# Patient Record
Sex: Female | Born: 1935 | Race: Black or African American | Hispanic: No | Smoking: Never smoker
Health system: Southern US, Community
[De-identification: ages and names within clinical notes are randomized; demographics above are authoritative.]

## PROBLEM LIST (undated history)

## (undated) DIAGNOSIS — E7800 Pure hypercholesterolemia, unspecified: Secondary | ICD-10-CM

## (undated) DIAGNOSIS — C801 Malignant (primary) neoplasm, unspecified: Secondary | ICD-10-CM

## (undated) DIAGNOSIS — K259 Gastric ulcer, unspecified as acute or chronic, without hemorrhage or perforation: Secondary | ICD-10-CM

## (undated) DIAGNOSIS — I10 Essential (primary) hypertension: Secondary | ICD-10-CM

## (undated) HISTORY — PX: TONSILLECTOMY: SUR1361

## (undated) HISTORY — PX: BREAST SURGERY: SHX581

---

## 2015-11-17 ENCOUNTER — Encounter (HOSPITAL_COMMUNITY): Payer: Self-pay | Admitting: Emergency Medicine

## 2015-11-17 ENCOUNTER — Emergency Department (HOSPITAL_COMMUNITY)
Admission: EM | Admit: 2015-11-17 | Discharge: 2015-11-17 | Disposition: A | Discharge status: Home / Self Care | Payer: Medicare Other | Attending: Emergency Medicine | Admitting: Emergency Medicine

## 2015-11-17 ENCOUNTER — Emergency Department (HOSPITAL_COMMUNITY): Payer: Medicare Other

## 2015-11-17 DIAGNOSIS — R062 Wheezing: Secondary | ICD-10-CM

## 2015-11-17 DIAGNOSIS — R531 Weakness: Secondary | ICD-10-CM | POA: Diagnosis not present

## 2015-11-17 DIAGNOSIS — R112 Nausea with vomiting, unspecified: Secondary | ICD-10-CM | POA: Diagnosis present

## 2015-11-17 DIAGNOSIS — I10 Essential (primary) hypertension: Secondary | ICD-10-CM | POA: Insufficient documentation

## 2015-11-17 HISTORY — DX: Malignant (primary) neoplasm, unspecified: C80.1

## 2015-11-17 HISTORY — DX: Pure hypercholesterolemia, unspecified: E78.00

## 2015-11-17 HISTORY — DX: Essential (primary) hypertension: I10

## 2015-11-17 HISTORY — DX: Gastric ulcer, unspecified as acute or chronic, without hemorrhage or perforation: K25.9

## 2015-11-17 LAB — CBC
HEMATOCRIT: 42.3 % (ref 36.0–46.0)
Hemoglobin: 13.6 g/dL (ref 12.0–15.0)
MCH: 30.4 pg (ref 26.0–34.0)
MCHC: 32.2 g/dL (ref 30.0–36.0)
MCV: 94.6 fL (ref 78.0–100.0)
PLATELETS: 236 10*3/uL (ref 150–400)
RBC: 4.47 MIL/uL (ref 3.87–5.11)
RDW: 12.3 % (ref 11.5–15.5)
WBC: 11 10*3/uL — AB (ref 4.0–10.5)

## 2015-11-17 LAB — COMPREHENSIVE METABOLIC PANEL
ALBUMIN: 3.8 g/dL (ref 3.5–5.0)
ALT: 12 U/L — AB (ref 14–54)
AST: 18 U/L (ref 15–41)
Alkaline Phosphatase: 56 U/L (ref 38–126)
Anion gap: 10 (ref 5–15)
BUN: 19 mg/dL (ref 6–20)
CHLORIDE: 98 mmol/L — AB (ref 101–111)
CO2: 32 mmol/L (ref 22–32)
CREATININE: 1.11 mg/dL — AB (ref 0.44–1.00)
Calcium: 9.2 mg/dL (ref 8.9–10.3)
GFR calc Af Amer: 54 mL/min — ABNORMAL LOW (ref >60)
GFR, EST NON AFRICAN AMERICAN: 46 mL/min — AB (ref >60)
Glucose, Bld: 156 mg/dL — ABNORMAL HIGH (ref 65–99)
POTASSIUM: 3.9 mmol/L (ref 3.5–5.1)
SODIUM: 140 mmol/L (ref 135–145)
Total Bilirubin: 0.8 mg/dL (ref 0.3–1.2)
Total Protein: 7.2 g/dL (ref 6.5–8.1)

## 2015-11-17 LAB — URINALYSIS, ROUTINE W REFLEX MICROSCOPIC
GLUCOSE, UA: NEGATIVE mg/dL
HGB URINE DIPSTICK: NEGATIVE
KETONES UR: 15 mg/dL — AB
LEUKOCYTES UA: NEGATIVE
Nitrite: NEGATIVE
PH: 5 (ref 5.0–8.0)
Protein, ur: NEGATIVE mg/dL
Specific Gravity, Urine: 1.03 (ref 1.005–1.030)

## 2015-11-17 LAB — LIPASE, BLOOD: LIPASE: 19 U/L (ref 11–51)

## 2015-11-17 MED ORDER — ONDANSETRON 4 MG PO TBDP: 4.0000 mg | ORAL_TABLET | Freq: Three times a day (TID) | ORAL | Status: DC | PRN

## 2015-11-17 MED ORDER — ALBUTEROL SULFATE HFA 108 (90 BASE) MCG/ACT IN AERS
2.0000 | INHALATION_SPRAY | Freq: Once | RESPIRATORY_TRACT | Status: AC
  Administered 2015-11-17: 2 via RESPIRATORY_TRACT
  Filled 2015-11-17: qty 6.7

## 2015-11-17 MED ORDER — METHYLPREDNISOLONE SODIUM SUCC 125 MG IJ SOLR
125.0000 mg | Freq: Once | INTRAMUSCULAR | Status: AC
  Administered 2015-11-17: 125 mg via INTRAVENOUS
  Filled 2015-11-17: qty 2

## 2015-11-17 MED ORDER — ONDANSETRON HCL 4 MG/2ML IJ SOLN
4.0000 mg | Freq: Once | INTRAMUSCULAR | Status: AC
  Administered 2015-11-17: 4 mg via INTRAVENOUS
  Filled 2015-11-17: qty 2

## 2015-11-17 MED ORDER — ALBUTEROL (5 MG/ML) CONTINUOUS INHALATION SOLN
10.0000 mg/h | INHALATION_SOLUTION | Freq: Once | RESPIRATORY_TRACT | Status: AC
  Administered 2015-11-17: 10 mg/h via RESPIRATORY_TRACT
  Filled 2015-11-17: qty 20

## 2015-11-17 MED ORDER — IPRATROPIUM-ALBUTEROL 0.5-2.5 (3) MG/3ML IN SOLN: 3.0000 mL | Freq: Once | RESPIRATORY_TRACT | Status: DC

## 2015-11-17 MED ORDER — SODIUM CHLORIDE 0.9 % IV SOLN
INTRAVENOUS | Status: DC
  Administered 2015-11-17: 05:00:00 via INTRAVENOUS

## 2015-11-17 MED ORDER — ALBUTEROL SULFATE HFA 108 (90 BASE) MCG/ACT IN AERS: 2.0000 | INHALATION_SPRAY | RESPIRATORY_TRACT | Status: AC | PRN

## 2015-11-17 MED ORDER — PREDNISONE 20 MG PO TABS: 60.0000 mg | ORAL_TABLET | Freq: Every day | ORAL | Status: DC

## 2015-11-17 NOTE — ED Notes (Signed)
Urine specimen obtained and sent to lab

## 2015-11-17 NOTE — ED Notes (Signed)
Patient complaining of cough x 2 weeks, vomiting and weakness starting tonight.

## 2015-11-17 NOTE — Discharge Instructions (Signed)
How to Use an Inhaler Proper inhaler technique is very important. Good technique ensures that the medicine reaches the lungs. Poor technique results in depositing the medicine on the tongue and back of the throat rather than in the airways. If you do not use the inhaler with good technique, the medicine will not help you. STEPS TO FOLLOW IF USING AN INHALER WITHOUT AN EXTENSION TUBE  Remove the cap from the inhaler.  If you are using the inhaler for the first time, you will need to prime it. Shake the inhaler for 5 seconds and release four puffs into the air, away from your face. Ask your health care provider or pharmacist if you have questions about priming your inhaler.  Shake the inhaler for 5 seconds before each breath in (inhalation).  Position the inhaler so that the top of the canister faces up.  Put your index finger on the top of the medicine canister. Your thumb supports the bottom of the inhaler.  Open your mouth.  Either place the inhaler between your teeth and place your lips tightly around the mouthpiece, or hold the inhaler 1-2 inches away from your open mouth. If you are unsure of which technique to use, ask your health care provider.  Breathe out (exhale) normally and as completely as possible.  Press the canister down with your index finger to release the medicine.  At the same time as the canister is pressed, inhale deeply and slowly until your lungs are completely filled. This should take 4-6 seconds. Keep your tongue down.  Hold the medicine in your lungs for 5-10 seconds (10 seconds is best). This helps the medicine get into the small airways of your lungs.  Breathe out slowly, through pursed lips. Whistling is an example of pursed lips.  Wait at least 15-30 seconds between puffs. Continue with the above steps until you have taken the number of puffs your health care provider has ordered. Do not use the inhaler more than your health care provider tells  you.  Replace the cap on the inhaler.  Follow the directions from your health care provider or the inhaler insert for cleaning the inhaler. STEPS TO FOLLOW IF USING AN INHALER WITH AN EXTENSION (SPACER)  Remove the cap from the inhaler.  If you are using the inhaler for the first time, you will need to prime it. Shake the inhaler for 5 seconds and release four puffs into the air, away from your face. Ask your health care provider or pharmacist if you have questions about priming your inhaler.  Shake the inhaler for 5 seconds before each breath in (inhalation).  Place the open end of the spacer onto the mouthpiece of the inhaler.  Position the inhaler so that the top of the canister faces up and the spacer mouthpiece faces you.  Put your index finger on the top of the medicine canister. Your thumb supports the bottom of the inhaler and the spacer.  Breathe out (exhale) normally and as completely as possible.  Immediately after exhaling, place the spacer between your teeth and into your mouth. Close your lips tightly around the spacer.  Press the canister down with your index finger to release the medicine.  At the same time as the canister is pressed, inhale deeply and slowly until your lungs are completely filled. This should take 4-6 seconds. Keep your tongue down and out of the way.  Hold the medicine in your lungs for 5-10 seconds (10 seconds is best). This helps the  medicine get into the small airways of your lungs. Exhale.  Repeat inhaling deeply through the spacer mouthpiece. Again hold that breath for up to 10 seconds (10 seconds is best). Exhale slowly. If it is difficult to take this second deep breath through the spacer, breathe normally several times through the spacer. Remove the spacer from your mouth.  Wait at least 15-30 seconds between puffs. Continue with the above steps until you have taken the number of puffs your health care provider has ordered. Do not use the  inhaler more than your health care provider tells you.  Remove the spacer from the inhaler, and place the cap on the inhaler.  Follow the directions from your health care provider or the inhaler insert for cleaning the inhaler and spacer. If you are using different kinds of inhalers, use your quick relief medicine to open the airways 10-15 minutes before using a steroid if instructed to do so by your health care provider. If you are unsure which inhalers to use and the order of using them, ask your health care provider, nurse, or respiratory therapist. If you are using a steroid inhaler, always rinse your mouth with water after your last puff, then gargle and spit out the water. Do not swallow the water. AVOID:  Inhaling before or after starting the spray of medicine. It takes practice to coordinate your breathing with triggering the spray.  Inhaling through the nose (rather than the mouth) when triggering the spray. HOW TO DETERMINE IF YOUR INHALER IS FULL OR NEARLY EMPTY You cannot know when an inhaler is empty by shaking it. A few inhalers are now being made with dose counters. Ask your health care provider for a prescription that has a dose counter if you feel you need that extra help. If your inhaler does not have a counter, ask your health care provider to help you determine the date you need to refill your inhaler. Write the refill date on a calendar or your inhaler canister. Refill your inhaler 7-10 days before it runs out. Be sure to keep an adequate supply of medicine. This includes making sure it is not expired, and that you have a spare inhaler.  SEEK MEDICAL CARE IF:   Your symptoms are only partially relieved with your inhaler.  You are having trouble using your inhaler.  You have some increase in phlegm. SEEK IMMEDIATE MEDICAL CARE IF:   You feel little or no relief with your inhalers. You are still wheezing and are feeling shortness of breath or tightness in your chest or  both.  You have dizziness, headaches, or a fast heart rate.  You have chills, fever, or night sweats.  You have a noticeable increase in phlegm production, or there is blood in the phlegm. MAKE SURE YOU:   Understand these instructions.  Will watch your condition.  Will get help right away if you are not doing well or get worse.   This information is not intended to replace advice given to you by your health care provider. Make sure you discuss any questions you have with your health care provider.   Document Released: 08/04/2000 Document Revised: 05/28/2013 Document Reviewed: 03/06/2013 Elsevier Interactive Patient Education 2016 Else[employer] Inc[employer]  Cough, Adult Coughing is a reflex that clears your throat and your airways. Coughing helps to heal and protect your lungs. It is normal to cough occasionally, but a cough that happens with other symptoms or lasts a long time may be a sign of a condition that  needs treatment. A cough may last only 2-3 weeks (acute), or it may last longer than 8 weeks (chronic). CAUSES Coughing is commonly caused by:  Breathing in substances that irritate your lungs.  A viral or bacterial respiratory infection.  Allergies.  Asthma.  Postnasal drip.  Smoking.  Acid backing up from the stomach into the esophagus (gastroesophageal reflux).  Certain medicines.  Chronic lung problems, including COPD (or rarely, lung cancer).  Other medical conditions such as heart failure. HOME CARE INSTRUCTIONS  Pay attention to any changes in your symptoms. Take these actions to help with your discomfort:  Take medicines only as told by your health care provider.  If you were prescribed an antibiotic medicine, take it as told by your health care provider. Do not stop taking the antibiotic even if you start to feel better.  Talk with your health care provider before you take a cough suppressant medicine.  Drink enough fluid to keep your urine clear or pale  yellow.  If the air is dry, use a cold steam vaporizer or humidifier in your bedroom or your home to help loosen secretions.  Avoid anything that causes you to cough at work or at home.  If your cough is worse at night, try sleeping in a semi-upright position.  Avoid cigarette smoke. If you smoke, quit smoking. If you need help quitting, ask your health care provider.  Avoid caffeine.  Avoid alcohol.  Rest as needed. SEEK MEDICAL CARE IF:   You have new symptoms.  You cough up pus.  Your cough does not get better after 2-3 weeks, or your cough gets worse.  You cannot control your cough with suppressant medicines and you are losing sleep.  You develop pain that is getting worse or pain that is not controlled with pain medicines.  You have a fever.  You have unexplained weight loss.  You have night sweats. SEEK IMMEDIATE MEDICAL CARE IF:  You cough up blood.  You have difficulty breathing.  Your heartbeat is very fast.   This information is not intended to replace advice given to you by your health care provider. Make sure you discuss any questions you have with your health care provider.   Document Released: 02/03/2011 Document Revised: 04/28/2015 Document Reviewed: 10/14/2014 Elsevier Interactive Patient Education 2016 Else[employer] Inc.  Bronchospasm, Adult A bronchospasm is when the tubes that carry air in and out of your lungs (airways) spasm or tighten. During a bronchospasm it is hard to breathe. This is because the airways get smaller. A bronchospasm can be triggered by:  Allergies. These may be to animals, pollen, food, or mold.  Infection. This is a common cause of bronchospasm.  Exercise.  Irritants. These include pollution, cigarette smoke, strong odors, aerosol sprays, and paint fumes.  Weather changes.  Stress.  Being emotional. HOME CARE   Always have a plan for getting help. Know when to call your doctor and local emergency services (911 in the  [place] Know where you can get emergency care.  Only take medicines as told by your doctor.  If you were prescribed an inhaler or nebulizer machine, ask your doctor how to use it correctly. Always use a spacer with your inhaler if you were given one.  Stay calm during an attack. Try to relax and breathe more slowly.  Control your home environment:  Change your heating and air conditioning filter at least once a month.  Limit your use of fireplaces and wood stoves.  Do not  smoke.  Do not  allow smoking in your home.  Avoid perfumes and fragrances.  Get rid of pests (such as roaches and mice) and their droppings.  Throw away plants if you see mold on them.  Keep your house clean and dust free.  Replace carpet with wood, tile, or vinyl flooring. Carpet can trap dander and dust.  Use allergy-proof pillows, mattress covers, and box spring covers.  Wash bed sheets and blankets every week in hot water. Dry them in a dryer.  Use blankets that are made of polyester or cotton.  Wash hands frequently. GET HELP IF:  You have muscle aches.  You have chest pain.  The thick spit you spit or cough up (sputum) changes from clear or white to yellow, green, gray, or bloody.  The thick spit you spit or cough up gets thicker.  There are problems that may be related to the medicine you are given such as:  A rash.  Itching.  Swelling.  Trouble breathing. GET HELP RIGHT AWAY IF:  You feel you cannot breathe or catch your breath.  You cannot stop coughing.  Your treatment is not helping you breathe better.  You have very bad chest pain. MAKE SURE YOU:   Understand these instructions.  Will watch your condition.  Will get help right away if you are not doing well or get worse.   This information is not intended to replace advice given to you by your health care provider. Make sure you discuss any questions you have with your health care provider.   Document Released:  06/04/2009 Document Revised: 08/28/2014 Document Reviewed: 01/28/2013 Elsevier Interactive Patient Education 2016 Elsevier Inc.    Nausea and Vomiting Nausea is a sick feeling that often comes before throwing up (vomiting). Vomiting is a reflex where stomach contents come out of your mouth. Vomiting can cause severe loss of body fluids (dehydration). Children and elderly adults can become dehydrated quickly, especially if they also have diarrhea. Nausea and vomiting are symptoms of a condition or disease. It is important to find the cause of your symptoms. CAUSES   Direct irritation of the stomach lining. This irritation can result from increased acid production (gastroesophageal reflux disease), infection, food poisoning, taking certain medicines (such as nonsteroidal anti-inflammatory drugs), alcohol use, or tobacco use.  Signals from the brain.These signals could be caused by a headache, heat exposure, an inner ear disturbance, increased pressure in the brain from injury, infection, a tumor, or a concussion, pain, emotional stimulus, or metabolic problems.  An obstruction in the gastrointestinal tract (bowel obstruction).  Illnesses such as diabetes, hepatitis, gallbladder problems, appendicitis, kidney problems, cancer, sepsis, atypical symptoms of a heart attack, or eating disorders.  Medical treatments such as chemotherapy and radiation.  Receiving medicine that makes you sleep (general anesthetic) during surgery. DIAGNOSIS Your caregiver may ask for tests to be done if the problems do not improve after a few days. Tests may also be done if symptoms are severe or if the reason for the nausea and vomiting is not clear. Tests may include:  Urine tests.  Blood tests.  Stool tests.  Cultures (to look for evidence of infection).  X-rays or other imaging studies. Test results can help your caregiver make decisions about treatment or the need for additional tests. TREATMENT You  need to stay well hydrated. Drink frequently but in small amounts.You may wish to drink water, sports drinks, clear broth, or eat frozen ice pops or gelatin dessert to help stay hydrated.When you eat,  eating slowly may help prevent nausea.There are also some antinausea medicines that may help prevent nausea. HOME CARE INSTRUCTIONS   Take all medicine as directed by your caregiver.  If you do not have an appetite, do not force yourself to eat. However, you must continue to drink fluids.  If you have an appetite, eat a normal diet unless your caregiver tells you differently.  Eat a variety of complex carbohydrates (rice, wheat, potatoes, bread), lean meats, yogurt, fruits, and vegetables.  Avoid high-fat foods because they are more difficult to digest.  Drink enough water and fluids to keep your urine clear or pale yellow.  If you are dehydrated, ask your caregiver for specific rehydration instructions. Signs of dehydration may include:  Severe thirst.  Dry lips and mouth.  Dizziness.  Dark urine.  Decreasing urine frequency and amount.  Confusion.  Rapid breathing or pulse. SEEK IMMEDIATE MEDICAL CARE IF:   You have blood or brown flecks (like coffee grounds) in your vomit.  You have black or bloody stools.  You have a severe headache or stiff neck.  You are confused.  You have severe abdominal pain.  You have chest pain or trouble breathing.  You do not urinate at least once every 8 hours.  You develop cold or clammy skin.  You continue to vomit for longer than 24 to 48 hours.  You have a fever. MAKE SURE YOU:   Understand these instructions.  Will watch your condition.  Will get help right away if you are not doing well or get worse.   This information is not intended to replace advice given to you by your health care provider. Make sure you discuss any questions you have with your health care provider.   Document Released: 08/07/2005 Document Revised:  10/30/2011 Document Reviewed: 01/04/2011 Elsevier Interactive Patient Education [employer].

## 2015-11-17 NOTE — ED Notes (Signed)
Pt reports N/V and weakness- she denies any pain of chest, abd, or urinary symptoms. She is tremulous upon arrival to rm 9, but is still and conversant currently

## 2015-11-17 NOTE — ED Provider Notes (Signed)
Patient felt better after hour-long neb. Both she and family agreed to discharge. Discharge prescriptions per Dr. Leonides Schanz.  Nat Christen, MD 11/17/15 [phone]

## 2015-11-17 NOTE — ED Provider Notes (Signed)
TIME SEEN: 4:10 AM  CHIEF COMPLAINT: Cough, vomiting, generalized weakness  HPI: Pt is a 81 y.o. female with history of hypertension, hyperlipidemia, gastric ulcer who presents to the emergency department with complaints of nausea and vomiting that started last night and generalized weakness. Has also had a cough for the past 2 weeks with white sputum production. Denies any chest pain or chest discomfort. No abdominal pain. No fevers. Has been hot and cold. No shortness of breath currently. No history of asthma or COPD. No sick contacts or recent travel. No antibody cues or hospitalization. No prior abdominal surgery. No diarrhea. No dysuria, hematuria, vaginal bleeding or discharge.  ROS: See HPI Constitutional: no fever  Eyes: no drainage  ENT: no runny nose   Cardiovascular:  no chest pain  Resp: no SOB  GI: no vomiting GU: no dysuria Integumentary: no rash  Allergy: no hives  Musculoskeletal: no leg swelling  Neurological: no slurred speech ROS otherwise negative  PAST MEDICAL HISTORY/PAST SURGICAL HISTORY:  Past Medical History  Diagnosis Date  . Hypertension   . Stomach ulcer   . High cholesterol   . [facility])     MEDICATIONS:  Prior to Admission medications   Not on File    ALLERGIES:  Allergies  Allergen Reactions  . Aspirin     SOCIAL HISTORY:  Social History  Substance Use Topics  . Smoking status: Never Smoker   . Smokeless tobacco: Not on file  . Alcohol Use: No    FAMILY HISTORY: History reviewed. No pertinent family history.  EXAM: BP 134/66 mmHg  Pulse 69  Temp(Src) 98.9 F (37.2 C) (Oral)  Resp 28  Ht 5\' 4"  (1.626 m)  Wt 196 lb (88.905 kg)  BMI 33.63 kg/m2  SpO2 91% CONSTITUTIONAL: Alert and oriented and responds appropriately to questions. Chronically ill-appearing, appears well-hydrated, afebrile and nontoxic, in no distress HEAD: Normocephalic EYES: Conjunctivae clear, PERRL ENT: normal nose; no rhinorrhea; moist mucous  membranes NECK: Supple, no meningismus, no LAD  CARD: RRR; S1 and S2 appreciated; no murmurs, no clicks, no rubs, no gallops RESP: Normal chest excursion without splinting or tachypnea; breath sounds are equal bilaterally but she does have mild expiratory wheezing at her bases bilaterally, no rhonchi, no rales, no hypoxia or respiratory distress, speaking full sentences ABD/GI: Normal bowel sounds; non-distended; soft, non-tender, no rebound, no guarding, no peritoneal signs, no tenderness on a brace point, negative Murphy sign BACK:  The back appears normal and is non-tender to palpation, there is no CVA tenderness EXT: Normal ROM in all joints; non-tender to palpation; no edema; normal capillary refill; no cyanosis, no calf tenderness or swelling    SKIN: Normal color for age and race; warm; no rash NEURO: Moves all extremities equally, sensation to light touch intact diffusely, cranial nerves II through XII intact PSYCH: The patient's mood and manner are appropriate. Grooming and personal hygiene are appropriate.  MEDICAL DECISION MAKING: Patient here with complaints of nausea, vomiting that started last night with generalized weakness. No diarrhea. She's afebrile and nontoxic appearing. Her abdominal exam is benign. Also complaining of cough for 2 weeks but no chest pain or shortness of breath. Does have some mild wheezing on exam. Will give duo neb. Chest x-ray ordered triage is unremarkable.  We'll obtain labs, urine. Differential diagnosis includes gastroenteritis, gastritis. Doubt ACS, bowel obstruction, colitis, diverticulitis, appendicitis, cholecystitis based on her benign exam and not having a complaints of pain. We'll give IV fluids, IV Zofran and fluid challenge patient.  ED PROGRESS: Patient's labs are unremarkable other than mild leukocytosis and mildly elevated creatinine. Urine shows small ketones but no sign of infection. She is able to drink without difficulty. Abdominal exam is  still benign. Patient has documented oxygen saturation in the upper 80s on room air but this is when she is sleeping. Suspect that this is a component of sleep apnea. When she wakes up her sats immediately jump into the upper 90s. She is able to ambulate and her oxygen saturation does not drop below 94%. She does not feel short of breath. She has not yet received her breathing treatment. On reevaluation she seems to have slightly worsened wheezing. We'll give continuous albuterol and Solu-Medrol. She was a previous smoker and smoked for 20 years but quit 30 years ago. States she's never been diagnosed with asthma or COPD. Her chest x-ray shows no infiltrate, edema. Doubt pulmonary embolus given she is not having tachycardia, tachypnea, chest pain or shortness of breath currently.  We'll reassess after continuous albuterol but I suspect that she will be able to be discharged home with steroid burst, Zofran to take as needed. Discussed return cautions with patient and her family at bedside. They're comfortable with this plan.    At this time, I do not feel there is any life-threatening condition present. I have reviewed and discussed all results (EKG, imaging, lab, urine as appropriate), exam findings with patient. I have reviewed nursing notes and appropriate previous records.  I feel the patient is safe to be discharged home without further emergent workup. Discussed usual and customary return precautions. Patient and family (if present) verbalize understanding and are comfortable with this plan.  Patient will follow-up with their primary care provider. If they do not have a primary care provider, information for follow-up has been provided to them. All questions have been answered.          [place], DO 11/17/15 [phone]

## 2015-11-17 NOTE — ED Notes (Signed)
Call to lab re: unable o draw labs

## 2016-11-05 ENCOUNTER — Emergency Department (HOSPITAL_COMMUNITY)
Admission: EM | Admit: 2016-11-05 | Discharge: 2016-11-05 | Disposition: A | Discharge status: Home / Self Care | Payer: Medicare Other | Attending: Emergency Medicine | Admitting: Emergency Medicine

## 2016-11-05 ENCOUNTER — Encounter (HOSPITAL_COMMUNITY): Payer: Self-pay | Admitting: *Deleted

## 2016-11-05 ENCOUNTER — Emergency Department (HOSPITAL_COMMUNITY): Payer: Medicare Other

## 2016-11-05 DIAGNOSIS — R112 Nausea with vomiting, unspecified: Secondary | ICD-10-CM | POA: Diagnosis present

## 2016-11-05 DIAGNOSIS — K529 Noninfective gastroenteritis and colitis, unspecified: Secondary | ICD-10-CM | POA: Diagnosis not present

## 2016-11-05 DIAGNOSIS — I10 Essential (primary) hypertension: Secondary | ICD-10-CM | POA: Insufficient documentation

## 2016-11-05 DIAGNOSIS — Z79899 Other long term (current) drug therapy: Secondary | ICD-10-CM | POA: Insufficient documentation

## 2016-11-05 DIAGNOSIS — R197 Diarrhea, unspecified: Secondary | ICD-10-CM

## 2016-11-05 LAB — COMPREHENSIVE METABOLIC PANEL
ALBUMIN: 4 g/dL (ref 3.5–5.0)
ALK PHOS: 40 U/L (ref 38–126)
ALT: 11 U/L — AB (ref 14–54)
ANION GAP: 8 (ref 5–15)
AST: 24 U/L (ref 15–41)
BUN: 15 mg/dL (ref 6–20)
CALCIUM: 9 mg/dL (ref 8.9–10.3)
CHLORIDE: 101 mmol/L (ref 101–111)
CO2: 30 mmol/L (ref 22–32)
CREATININE: 0.89 mg/dL (ref 0.44–1.00)
GFR calc Af Amer: >60 mL/min (ref >60)
GFR calc non Af Amer: >60 mL/min (ref >60)
GLUCOSE: 170 mg/dL — AB (ref 65–99)
Potassium: 3.7 mmol/L (ref 3.5–5.1)
Sodium: 139 mmol/L (ref 135–145)
Total Bilirubin: 1 mg/dL (ref 0.3–1.2)
Total Protein: 7.3 g/dL (ref 6.5–8.1)

## 2016-11-05 LAB — CBC WITH DIFFERENTIAL/PLATELET
BASOS ABS: 0 10*3/uL (ref 0.0–0.1)
BASOS PCT: 0 %
Eosinophils Absolute: 0 10*3/uL (ref 0.0–0.7)
Eosinophils Relative: 0 %
HCT: 42 % (ref 36.0–46.0)
Hemoglobin: 13.7 g/dL (ref 12.0–15.0)
LYMPHS PCT: 8 %
Lymphs Abs: 1 10*3/uL (ref 0.7–4.0)
MCH: 30.8 pg (ref 26.0–34.0)
MCHC: 32.6 g/dL (ref 30.0–36.0)
MCV: 94.4 fL (ref 78.0–100.0)
MONO ABS: 0.6 10*3/uL (ref 0.1–1.0)
Monocytes Relative: 5 %
NEUTROS ABS: 10 10*3/uL — AB (ref 1.7–7.7)
NEUTROS PCT: 87 %
Platelets: 208 10*3/uL (ref 150–400)
RBC: 4.45 MIL/uL (ref 3.87–5.11)
RDW: 12.6 % (ref 11.5–15.5)
WBC: 11.7 10*3/uL — AB (ref 4.0–10.5)

## 2016-11-05 LAB — URINALYSIS, ROUTINE W REFLEX MICROSCOPIC
BILIRUBIN URINE: NEGATIVE
Glucose, UA: NEGATIVE mg/dL
Hgb urine dipstick: NEGATIVE
Ketones, ur: NEGATIVE mg/dL
Leukocytes, UA: NEGATIVE
NITRITE: NEGATIVE
PH: 7 (ref 5.0–8.0)
Protein, ur: NEGATIVE mg/dL
Specific Gravity, Urine: 1.017 (ref 1.005–1.030)

## 2016-11-05 LAB — LIPASE, BLOOD: Lipase: 16 U/L (ref 11–51)

## 2016-11-05 MED ORDER — ONDANSETRON HCL 4 MG/2ML IJ SOLN
4.0000 mg | Freq: Once | INTRAMUSCULAR | Status: AC
  Administered 2016-11-05: 4 mg via INTRAVENOUS

## 2016-11-05 MED ORDER — SODIUM CHLORIDE 0.9 % IV BOLUS (SEPSIS)
1000.0000 mL | Freq: Once | INTRAVENOUS | Status: AC
  Administered 2016-11-05: 1000 mL via INTRAVENOUS

## 2016-11-05 MED ORDER — IOPAMIDOL (ISOVUE-300) INJECTION 61%
INTRAVENOUS | Status: AC
  Filled 2016-11-05: qty 30

## 2016-11-05 MED ORDER — IOPAMIDOL (ISOVUE-300) INJECTION 61%
INTRAVENOUS | Status: AC
  Filled 2016-11-05: qty 100

## 2016-11-05 MED ORDER — ONDANSETRON 4 MG PO TBDP: ORAL_TABLET | ORAL | 0 refills | Status: AC

## 2016-11-05 MED ORDER — ONDANSETRON HCL 4 MG/2ML IJ SOLN
INTRAMUSCULAR | Status: AC
  Filled 2016-11-05: qty 2

## 2016-11-05 MED ORDER — IOPAMIDOL (ISOVUE-300) INJECTION 61%
100.0000 mL | Freq: Once | INTRAVENOUS | Status: AC | PRN
  Administered 2016-11-05: 100 mL via INTRAVENOUS

## 2016-11-05 NOTE — Discharge Instructions (Signed)
Take Tylenol or Motrin for pain. Follow-up with her doctor this week if not improving

## 2016-11-05 NOTE — ED Notes (Signed)
Pt O2 saturation dropped to 84 % with good wave form. This nurse to room, pt was sleeping, awoke pt and encouraged taking deep breathes. O2 sats came up to 99% with this intervention.

## 2016-11-05 NOTE — ED Notes (Signed)
IV access attempted x 2 without success- Rip Harbour, RN, charge nurse notified.

## 2016-11-05 NOTE — ED Provider Notes (Signed)
CT scan is consistent with gastroenteritis. Patient symptoms have improved with Zofran. She no longer has nausea pain or diarrhea. She will be given Zofran. Told to take Tylenol or Motrin. Told to drink plenty of fluids and follow-up with her PCP this week   Milton Ferguson, MD 11/05/16 [id number]

## 2016-11-05 NOTE — ED Triage Notes (Signed)
Pt c/o abd pain with diarrhea, n/v that started during the night,

## 2016-11-05 NOTE — ED Notes (Signed)
Pt made aware to return if symptoms worsen or if any life threatening symptoms occur.   

## 2016-11-05 NOTE — ED Notes (Signed)
Pt taken to CT.

## 2016-11-05 NOTE — ED Notes (Signed)
Pt currently sleeping. Equal rise and fall noted to chest. Family members sleeping at well.

## 2016-11-05 NOTE — ED Provider Notes (Signed)
[facility] DEPT Provider Note   CSN: [id number] Arrival date & time: 11/05/16  0341     History   Chief Complaint Chief Complaint  Patient presents with  . Abdominal Pain    HPI Elizabeth Weaver is a 82 y.o. female.  Patient woke from sleep around 2 AM with epigastric pain, vomiting and diarrhea. Has had 3 episodes of clear emesis and one episode of loose stool. Denies any blood in the emesis or stool. No sick contacts. No recent travel. No recent antibiotic use. Felt well when she went to bed around 10 PM. She denies any chest pain or shortness of breath. Pain is constant and worse with palpation. She's never had this before. History list ulcers though patient denies this. Does not take any medications. Denies any fever. Denies any urinary or vaginal symptoms.   The history is provided by the patient.  Abdominal Pain   Associated symptoms include diarrhea, nausea and vomiting. Pertinent negatives include fever, dysuria, hematuria, headaches, arthralgias and myalgias.    Past Medical History:  Diagnosis Date  . Cancer ([place])   . High cholesterol   . Hypertension   . Stomach ulcer     There are no active problems to display for this patient.   Past Surgical History:  Procedure Laterality Date  . BREAST SURGERY    . TONSILLECTOMY      OB History    No data available       Home Medications    Prior to Admission medications   Medication Sig Start Date End Date Taking? Authorizing Provider  albuterol (PROVENTIL HFA;VENTOLIN HFA) 108 (90 Base) MCG/ACT inhaler Inhale 2 puffs into the lungs every 4 (four) hours as needed for wheezing or shortness of breath. 11/17/15  Yes Kristen N Ward, DO  atenolol (TENORMIN) 25 MG tablet Take 50 mg by mouth daily. Take two tablets daily   Yes Historical Provider, MD  atorvastatin (LIPITOR) 20 MG tablet Take 20 mg by mouth daily.   Yes Historical Provider, MD  hydrochlorothiazide (HYDRODIURIL) 25 MG tablet Take 25 mg by mouth daily.    Yes Historical Provider, MD  NIFEdipine (ADALAT CC) 90 MG 24 hr tablet Take 90 mg by mouth daily.   Yes Historical Provider, MD  Potassium Chloride CR (MICRO-K) 8 MEQ CPCR capsule CR Take 8 mEq by mouth daily. Take 3 tablets daily   Yes Historical Provider, MD  quinapril (ACCUPRIL) 40 MG tablet Take 40 mg by mouth daily.   Yes Historical Provider, MD  tamoxifen (NOLVADEX) 20 MG tablet Take 20 mg by mouth daily.   Yes Historical Provider, MD  ondansetron (ZOFRAN ODT) 4 MG disintegrating tablet Take 1 tablet (4 mg total) by mouth every 8 (eight) hours as needed for nausea or vomiting. 11/17/15   Kristen N Ward, DO  predniSONE (DELTASONE) 20 MG tablet Take 3 tablets (60 mg total) by mouth daily. 11/17/15   Hawkins, DO    Family History No family history on file.  Social History Social History  Substance Use Topics  . Smoking status: Never Smoker  . Smokeless tobacco: Never Used  . Alcohol use No     Allergies   Aspirin   Review of Systems Review of Systems  Constitutional: Positive for activity change and appetite change. Negative for fever.  HENT: Negative for congestion and rhinorrhea.   Respiratory: Negative for cough, chest tightness and shortness of breath.   Cardiovascular: Negative for chest pain.  Gastrointestinal: Positive for abdominal pain, diarrhea,  nausea and vomiting.  Genitourinary: Negative for dysuria, hematuria, vaginal bleeding and vaginal discharge.  Musculoskeletal: Negative for arthralgias and myalgias.  Skin: Negative for rash.  Neurological: Negative for dizziness, weakness and headaches.  A complete 10 system review of systems was obtained and all systems are negative except as noted in the HPI and PMH.     Physical Exam Updated Vital Signs BP 133/64   Pulse 80   Temp 98.1 F (36.7 C) (Oral)   Resp 17   Ht 5\' 4"  (1.626 m)   Wt 190 lb (86.2 kg)   SpO2 98%   BMI 32.61 kg/m   Physical Exam  Constitutional: She is oriented to person, place,  and time. She appears well-developed and well-nourished. No distress.  Spitting up clear emesis  HENT:  Head: Normocephalic and atraumatic.  Mouth/Throat: Oropharynx is clear and moist. No oropharyngeal exudate.  Eyes: Conjunctivae and EOM are normal. Pupils are equal, round, and reactive to light.  Neck: Normal range of motion. Neck supple.  No meningismus.  Cardiovascular: Normal rate, regular rhythm, normal heart sounds and intact distal pulses.   No murmur heard. Pulmonary/Chest: Effort normal and breath sounds normal. No respiratory distress.  Abdominal: Soft. There is tenderness. There is no rebound and no guarding.  Epigastric tenderness, no guarding or rebound  Musculoskeletal: Normal range of motion. She exhibits no edema or tenderness.  Neurological: She is alert and oriented to person, place, and time. No cranial nerve deficit. She exhibits normal muscle tone. Coordination normal.   5/5 strength throughout. CN 2-12 intact.Equal grip strength.   Skin: Skin is warm.  Psychiatric: She has a normal mood and affect. Her behavior is normal.  Nursing note and vitals reviewed.    ED Treatments / Results  Labs (all labs ordered are listed, but only abnormal results are displayed) Labs Reviewed  CBC WITH DIFFERENTIAL/PLATELET - Abnormal; Notable for the following:       Result Value   WBC 11.7 (*)    Neutro Abs 10.0 (*)    All other components within normal limits  COMPREHENSIVE METABOLIC PANEL - Abnormal; Notable for the following:    Glucose, Bld 170 (*)    ALT 11 (*)    All other components within normal limits  LIPASE, BLOOD  URINALYSIS, ROUTINE W REFLEX MICROSCOPIC    EKG  EKG Interpretation  Date/Time:  Sunday November 05 2016 04:34:54 EDT Ventricular Rate:  72 PR Interval:    QRS Duration: 89 QT Interval:  389 QTC Calculation: 426 R Axis:   79 Text Interpretation:  Sinus rhythm Consider left atrial enlargement No previous ECGs available Confirmed by Wyvonnia Dusky  MD,  Nadir Vasques ([id number]) on 11/05/2016 5:00:45 AM       Radiology No results found.  Procedures Procedures (including critical care time)  Medications Ordered in ED Medications  sodium chloride 0.9 % bolus 1,000 mL (1,000 mLs Intravenous New Bag/Given 11/05/16 0516)  ondansetron (ZOFRAN) injection 4 mg (4 mg Intravenous Given 11/05/16 0515)     Initial Impression / Assessment and Plan / ED Course  I have reviewed the triage vital signs and the nursing notes.  Pertinent labs & imaging results that were available during my care of the patient were reviewed by me and considered in my medical decision making (see chart for details).     Epigastric pain with vomiting and one episode of diarrhea.  No blood in emesis or stool.  Felt well when she went to bed.  IVF, antiemetics, labs, UA.  Labs reassuring.  Vomiting has subsided.   CT pending for further evaluation.  UA pending as well.  Dr. Roderic Palau to assume care at shift change.  Final Clinical Impressions(s) / ED Diagnoses   Final diagnoses:  None    New Prescriptions New Prescriptions   No medications on file     Ezequiel Essex, MD 11/05/16 [phone]

## 2017-10-20 ENCOUNTER — Emergency Department (HOSPITAL_COMMUNITY)
Admission: EM | Admit: 2017-10-20 | Discharge: 2017-10-21 | Disposition: A | Discharge status: Home / Self Care | Payer: Medicare Other | Attending: Emergency Medicine | Admitting: Emergency Medicine

## 2017-10-20 ENCOUNTER — Emergency Department (HOSPITAL_COMMUNITY): Payer: Medicare Other

## 2017-10-20 ENCOUNTER — Encounter (HOSPITAL_COMMUNITY): Payer: Self-pay | Admitting: *Deleted

## 2017-10-20 ENCOUNTER — Other Ambulatory Visit: Payer: Self-pay

## 2017-10-20 DIAGNOSIS — J9801 Acute bronchospasm: Secondary | ICD-10-CM

## 2017-10-20 DIAGNOSIS — Z859 Personal history of malignant neoplasm, unspecified: Secondary | ICD-10-CM | POA: Diagnosis not present

## 2017-10-20 DIAGNOSIS — R531 Weakness: Secondary | ICD-10-CM | POA: Diagnosis present

## 2017-10-20 DIAGNOSIS — Z79899 Other long term (current) drug therapy: Secondary | ICD-10-CM | POA: Diagnosis not present

## 2017-10-20 DIAGNOSIS — J101 Influenza due to other identified influenza virus with other respiratory manifestations: Secondary | ICD-10-CM | POA: Diagnosis not present

## 2017-10-20 LAB — COMPREHENSIVE METABOLIC PANEL
ALK PHOS: 39 U/L (ref 38–126)
ALT: 16 U/L (ref 14–54)
ANION GAP: 13 (ref 5–15)
AST: 23 U/L (ref 15–41)
Albumin: 3.8 g/dL (ref 3.5–5.0)
BILIRUBIN TOTAL: 0.8 mg/dL (ref 0.3–1.2)
BUN: 17 mg/dL (ref 6–20)
CALCIUM: 9.2 mg/dL (ref 8.9–10.3)
CO2: 25 mmol/L (ref 22–32)
Chloride: 103 mmol/L (ref 101–111)
Creatinine, Ser: 1.2 mg/dL — ABNORMAL HIGH (ref 0.44–1.00)
GFR, EST AFRICAN AMERICAN: 48 mL/min — AB (ref >60)
GFR, EST NON AFRICAN AMERICAN: 42 mL/min — AB (ref >60)
Glucose, Bld: 109 mg/dL — ABNORMAL HIGH (ref 65–99)
Potassium: 3.3 mmol/L — ABNORMAL LOW (ref 3.5–5.1)
Sodium: 141 mmol/L (ref 135–145)
TOTAL PROTEIN: 7.1 g/dL (ref 6.5–8.1)

## 2017-10-20 LAB — CBC WITH DIFFERENTIAL/PLATELET
Basophils Absolute: 0 10*3/uL (ref 0.0–0.1)
Basophils Relative: 0 %
EOS ABS: 0 10*3/uL (ref 0.0–0.7)
Eosinophils Relative: 0 %
HEMATOCRIT: 39 % (ref 36.0–46.0)
HEMOGLOBIN: 12.4 g/dL (ref 12.0–15.0)
LYMPHS ABS: 0.8 10*3/uL (ref 0.7–4.0)
Lymphocytes Relative: 10 %
MCH: 30.4 pg (ref 26.0–34.0)
MCHC: 31.8 g/dL (ref 30.0–36.0)
MCV: 95.6 fL (ref 78.0–100.0)
MONOS PCT: 5 %
Monocytes Absolute: 0.4 10*3/uL (ref 0.1–1.0)
NEUTROS ABS: 6.9 10*3/uL (ref 1.7–7.7)
NEUTROS PCT: 85 %
Platelets: 166 10*3/uL (ref 150–400)
RBC: 4.08 MIL/uL (ref 3.87–5.11)
RDW: 12.8 % (ref 11.5–15.5)
WBC: 8.1 10*3/uL (ref 4.0–10.5)

## 2017-10-20 LAB — INFLUENZA PANEL BY PCR (TYPE A & B)
Influenza A By PCR: POSITIVE — AB
Influenza B By PCR: NEGATIVE

## 2017-10-20 MED ORDER — ALBUTEROL SULFATE (2.5 MG/3ML) 0.083% IN NEBU
5.0000 mg | INHALATION_SOLUTION | Freq: Once | RESPIRATORY_TRACT | Status: AC
  Administered 2017-10-20: 5 mg via RESPIRATORY_TRACT
  Filled 2017-10-20: qty 6

## 2017-10-20 MED ORDER — ACETAMINOPHEN 500 MG PO TABS
ORAL_TABLET | ORAL | Status: AC
  Filled 2017-10-20: qty 2

## 2017-10-20 MED ORDER — SODIUM CHLORIDE 0.9 % IV BOLUS (SEPSIS)
500.0000 mL | Freq: Once | INTRAVENOUS | Status: AC
  Administered 2017-10-20: 500 mL via INTRAVENOUS

## 2017-10-20 MED ORDER — ACETAMINOPHEN 500 MG PO TABS
1000.0000 mg | ORAL_TABLET | Freq: Once | ORAL | Status: AC
  Administered 2017-10-20: 1000 mg via ORAL

## 2017-10-20 MED ORDER — METHYLPREDNISOLONE SODIUM SUCC 125 MG IJ SOLR
125.0000 mg | Freq: Once | INTRAMUSCULAR | Status: AC
  Administered 2017-10-20: 125 mg via INTRAVENOUS
  Filled 2017-10-20: qty 2

## 2017-10-20 MED ORDER — OSELTAMIVIR PHOSPHATE 30 MG PO CAPS
30.0000 mg | ORAL_CAPSULE | Freq: Once | ORAL | Status: AC
  Administered 2017-10-21: 30 mg via ORAL
  Filled 2017-10-20: qty 1

## 2017-10-20 MED ORDER — ALBUTEROL (5 MG/ML) CONTINUOUS INHALATION SOLN
10.0000 mg/h | INHALATION_SOLUTION | RESPIRATORY_TRACT | Status: DC
  Administered 2017-10-20: 10 mg/h via RESPIRATORY_TRACT
  Filled 2017-10-20: qty 20

## 2017-10-20 NOTE — ED Provider Notes (Signed)
[facility] EMERGENCY DEPARTMENT Provider Note   CSN: [id number] Arrival date & time: 10/20/17  1759     History   Chief Complaint Chief Complaint  Patient presents with  . Weakness    HPI Elizabeth Weaver is a 83 y.o. female.  HPI Presents with acute onset fever, chills, generalized weakness, cough, shortness of breath and wheezing starting this morning.  She had mild nausea.  Denies chest pain or abdominal pain.  No nasal congestion or sore throat.  Denies headache.  Denies neck pain or stiffness.  No urinary symptoms.  Has been using nebulizer at home.  Has been exposed to relative with recent flu-like illness. Past Medical History:  Diagnosis Date  . Cancer ([place])   . High cholesterol   . Hypertension   . Stomach ulcer     There are no active problems to display for this patient.   Past Surgical History:  Procedure Laterality Date  . BREAST SURGERY    . TONSILLECTOMY      OB History    No data available       Home Medications    Prior to Admission medications   Medication Sig Start Date End Date Taking? Authorizing Provider  Cholecalciferol (VITAMIN D3) 1000 units CAPS Take 1 capsule by mouth daily. 06/14/11  Yes [provider]  albuterol (PROVENTIL HFA;VENTOLIN HFA) 108 (90 Base) MCG/ACT inhaler Inhale 2 puffs into the lungs every 4 (four) hours as needed for wheezing or shortness of breath. 11/17/15   Ward, Delice Bison, DO  atenolol (TENORMIN) 25 MG tablet Take 50 mg by mouth daily. Take two tablets daily    [provider]  atorvastatin (LIPITOR) 20 MG tablet Take 20 mg by mouth daily.    [provider]  hydrochlorothiazide (HYDRODIURIL) 25 MG tablet Take 25 mg by mouth daily.    [provider]  NIFEdipine (ADALAT CC) 90 MG 24 hr tablet Take 90 mg by mouth daily.    [provider]  ondansetron (ZOFRAN ODT) 4 MG disintegrating tablet 4mg  ODT q4 hours prn nausea/vomit 11/05/16   Milton Ferguson, MD  oseltamivir  (TAMIFLU) 75 MG capsule Take 1 capsule (75 mg total) by mouth every 12 (twelve) hours. 10/21/17   Orpah Greek, MD  Potassium Chloride CR (MICRO-K) 8 MEQ CPCR capsule CR Take 8 mEq by mouth daily. Take 3 tablets daily    [provider]  predniSONE (DELTASONE) 20 MG tablet Take 2 tablets (40 mg total) by mouth daily with breakfast. 10/21/17   Pollina, Gwenyth Allegra, MD  quinapril (ACCUPRIL) 40 MG tablet Take 40 mg by mouth daily.    [provider]  tamoxifen (NOLVADEX) 20 MG tablet Take 20 mg by mouth daily.    [provider]    Family History No family history on file.  Social History Social History   Tobacco Use  . Smoking status: Never Smoker  . Smokeless tobacco: Never Used  Substance Use Topics  . Alcohol use: No  . Drug use: No     Allergies   Aspirin   Review of Systems Review of Systems  Constitutional: Positive for chills, fatigue and fever.  HENT: Negative for congestion and sore throat.   Respiratory: Positive for cough, shortness of breath and wheezing.   Cardiovascular: Negative for chest pain, palpitations and leg swelling.  Gastrointestinal: Positive for nausea. Negative for abdominal pain, constipation, diarrhea and vomiting.  Genitourinary: Negative for difficulty urinating, dysuria, flank pain, frequency and hematuria.  Musculoskeletal:  Negative for back pain, myalgias, neck pain and neck stiffness.  Skin: Negative for rash and wound.  Neurological: Positive for weakness. Negative for dizziness, syncope, light-headedness, numbness and headaches.  All other systems reviewed and are negative.    Physical Exam Updated Vital Signs BP 131/63   Pulse (!) 113   Temp (!) 100.4 F (38 C) (Oral)   Resp 20   Ht 5\' 4"  (1.626 m)   Wt 88 kg (194 lb)   SpO2 96%   BMI 33.30 kg/m   Physical Exam  Constitutional: She is oriented to person, place, and time. She appears well-developed and well-nourished. No distress.  HENT:    Head: Normocephalic and atraumatic.  Mouth/Throat: Oropharynx is clear and moist. No oropharyngeal exudate.  Eyes: EOM are normal. Pupils are equal, round, and reactive to light.  Neck: Normal range of motion. Neck supple.  No meningismus  Cardiovascular: Normal rate and regular rhythm. Exam reveals no gallop and no friction rub.  No murmur heard. Pulmonary/Chest: Effort normal. She has wheezes.  Expiratory wheezing throughout  Abdominal: Soft. Bowel sounds are normal. There is no tenderness. There is no rebound and no guarding.  Musculoskeletal: Normal range of motion. She exhibits no edema or tenderness.  No midline thoracic or lumbar tenderness.  No CVA tenderness.  No lower extremity swelling, asymmetry or tenderness.  Distal pulses intact.  Lymphadenopathy:    She has no cervical adenopathy.  Neurological: She is alert and oriented to person, place, and time.  Moves all extremities without focal deficit.  Sensation fully intact.  Skin: Skin is warm and dry. Capillary refill takes less than 2 seconds. No rash noted. She is not diaphoretic. No erythema.  Psychiatric: She has a normal mood and affect. Her behavior is normal.  Nursing note and vitals reviewed.    ED Treatments / Results  Labs (all labs ordered are listed, but only abnormal results are displayed) Labs Reviewed  INFLUENZA PANEL BY PCR (TYPE A & B) - Abnormal; Notable for the following components:      Result Value   Influenza A By PCR POSITIVE (*)    All other components within normal limits  COMPREHENSIVE METABOLIC PANEL - Abnormal; Notable for the following components:   Potassium 3.3 (*)    Glucose, Bld 109 (*)    Creatinine, Ser 1.20 (*)    GFR calc non Af Amer 42 (*)    GFR calc Af Amer 48 (*)    All other components within normal limits  URINALYSIS, ROUTINE W REFLEX MICROSCOPIC - Abnormal; Notable for the following components:   APPearance HAZY (*)    Ketones, ur 20 (*)    Protein, ur 30 (*)     Bacteria, UA RARE (*)    Squamous Epithelial / LPF 0-5 (*)    All other components within normal limits  CBC WITH DIFFERENTIAL/PLATELET    EKG  EKG Interpretation  Date/Time:  Saturday October 20 2017 21:16:37 EST Ventricular Rate:  88 PR Interval:    QRS Duration: 91 QT Interval:  354 QTC Calculation: 429 R Axis:   80 Text Interpretation:  Sinus rhythm Ventricular premature complex Aberrant complex Right atrial enlargement Confirmed by Julianne Rice [phone] on 10/20/2017 9:48:16 PM       Radiology Dg Chest 2 View  Result Date: 10/20/2017 CLINICAL DATA:  Cough and fever.  Weakness. EXAM: CHEST  2 VIEW COMPARISON:  11/17/2015 FINDINGS: The cardiomediastinal contours are normal. The lungs are clear. Pulmonary vasculature is normal. No  consolidation, pleural effusion, or pneumothorax. No acute osseous abnormalities are seen. Surgical clips in the right breast/axilla. There is degenerative change in the spine. IMPRESSION: No acute abnormality. Electronically Signed   By: Jeb Levering M.D.   On: 10/20/2017 21:17    Procedures Procedures (including critical care time)  Medications Ordered in ED Medications  acetaminophen (TYLENOL) tablet 1,000 mg (1,000 mg Oral Given 10/20/17 2045)  methylPREDNISolone sodium succinate (SOLU-MEDROL) 125 mg/2 mL injection 125 mg (125 mg Intravenous Given 10/20/17 2158)  albuterol (PROVENTIL) (2.5 MG/3ML) 0.083% nebulizer solution 5 mg (5 mg Nebulization Given 10/20/17 2147)  sodium chloride 0.9 % bolus 500 mL (0 mLs Intravenous Stopped 10/21/17 0023)  oseltamivir (TAMIFLU) capsule 30 mg (30 mg Oral Given 10/21/17 0049)     Initial Impression / Assessment and Plan / ED Course  I have reviewed the triage vital signs and the nursing notes.  Pertinent labs & imaging results that were available during my care of the patient were reviewed by me and considered in my medical decision making (see chart for details).    Some improvement in wheezing after initial  breathing treatment.  Will give continuous nebulized treatment.  Chest x-ray without evidence of pneumonia.  Patient is influenza positive.  Disposition pending reevaluation.   Final Clinical Impressions(s) / ED Diagnoses   Final diagnoses:  Influenza A  Bronchospasm    ED Discharge Orders        Ordered    predniSONE (DELTASONE) 20 MG tablet  Daily with breakfast     10/21/17 0146    oseltamivir (TAMIFLU) 75 MG capsule  Every 12 hours     10/21/17 0146       Julianne Rice, MD 10/21/17 1501

## 2017-10-20 NOTE — ED Notes (Signed)
Pt for VS recheck  Cough   Fever elevated  Tylenol provided and CXR for cough due to fever and geriatric status

## 2017-10-20 NOTE — ED Triage Notes (Signed)
C/o weakness onset today, states she just feels like she doesn't have any energy

## 2017-10-21 LAB — URINALYSIS, ROUTINE W REFLEX MICROSCOPIC
Bilirubin Urine: NEGATIVE
GLUCOSE, UA: NEGATIVE mg/dL
Hgb urine dipstick: NEGATIVE
KETONES UR: 20 mg/dL — AB
Leukocytes, UA: NEGATIVE
NITRITE: NEGATIVE
PH: 5 (ref 5.0–8.0)
Protein, ur: 30 mg/dL — AB
Specific Gravity, Urine: 1.025 (ref 1.005–1.030)

## 2017-10-21 MED ORDER — PREDNISONE 20 MG PO TABS: 40.0000 mg | ORAL_TABLET | Freq: Every day | ORAL | 0 refills | Status: AC

## 2017-10-21 MED ORDER — OSELTAMIVIR PHOSPHATE 75 MG PO CAPS: 75.0000 mg | ORAL_CAPSULE | Freq: Two times a day (BID) | ORAL | 0 refills | Status: AC

## 2017-10-21 NOTE — ED Provider Notes (Signed)
Patient signed out to me while receiving a continuous nebulizer treatment.  She was diagnosed with influenza today.  She had some bronchospasm associated with the influenza.  She feels much improvement after the nebulizer treatment.  She was able to ambulate in the hall without becoming short of breath.  Her oxygen saturations did not go below 91%.  She is therefore reasonable for discharge, continue Tamiflu a bronchodilator therapy, prednisone.  Follow-up with PCP, return for worsening symptoms.   Orpah Greek, MD 10/21/17 [phone]

## 2017-10-21 NOTE — ED Notes (Signed)
Pt ambulated to the bathroom and back to room. Oxygen sats reached 92% at lowest point

## 2019-10-19 IMAGING — DX DG CHEST 2V
2 series · 2 of 2 positions shown · non-contrast
Comparison: 11/17/2015

CLINICAL DATA: Cough and fever.  Weakness.

EXAM:
CHEST  2 VIEW

[chest ap]
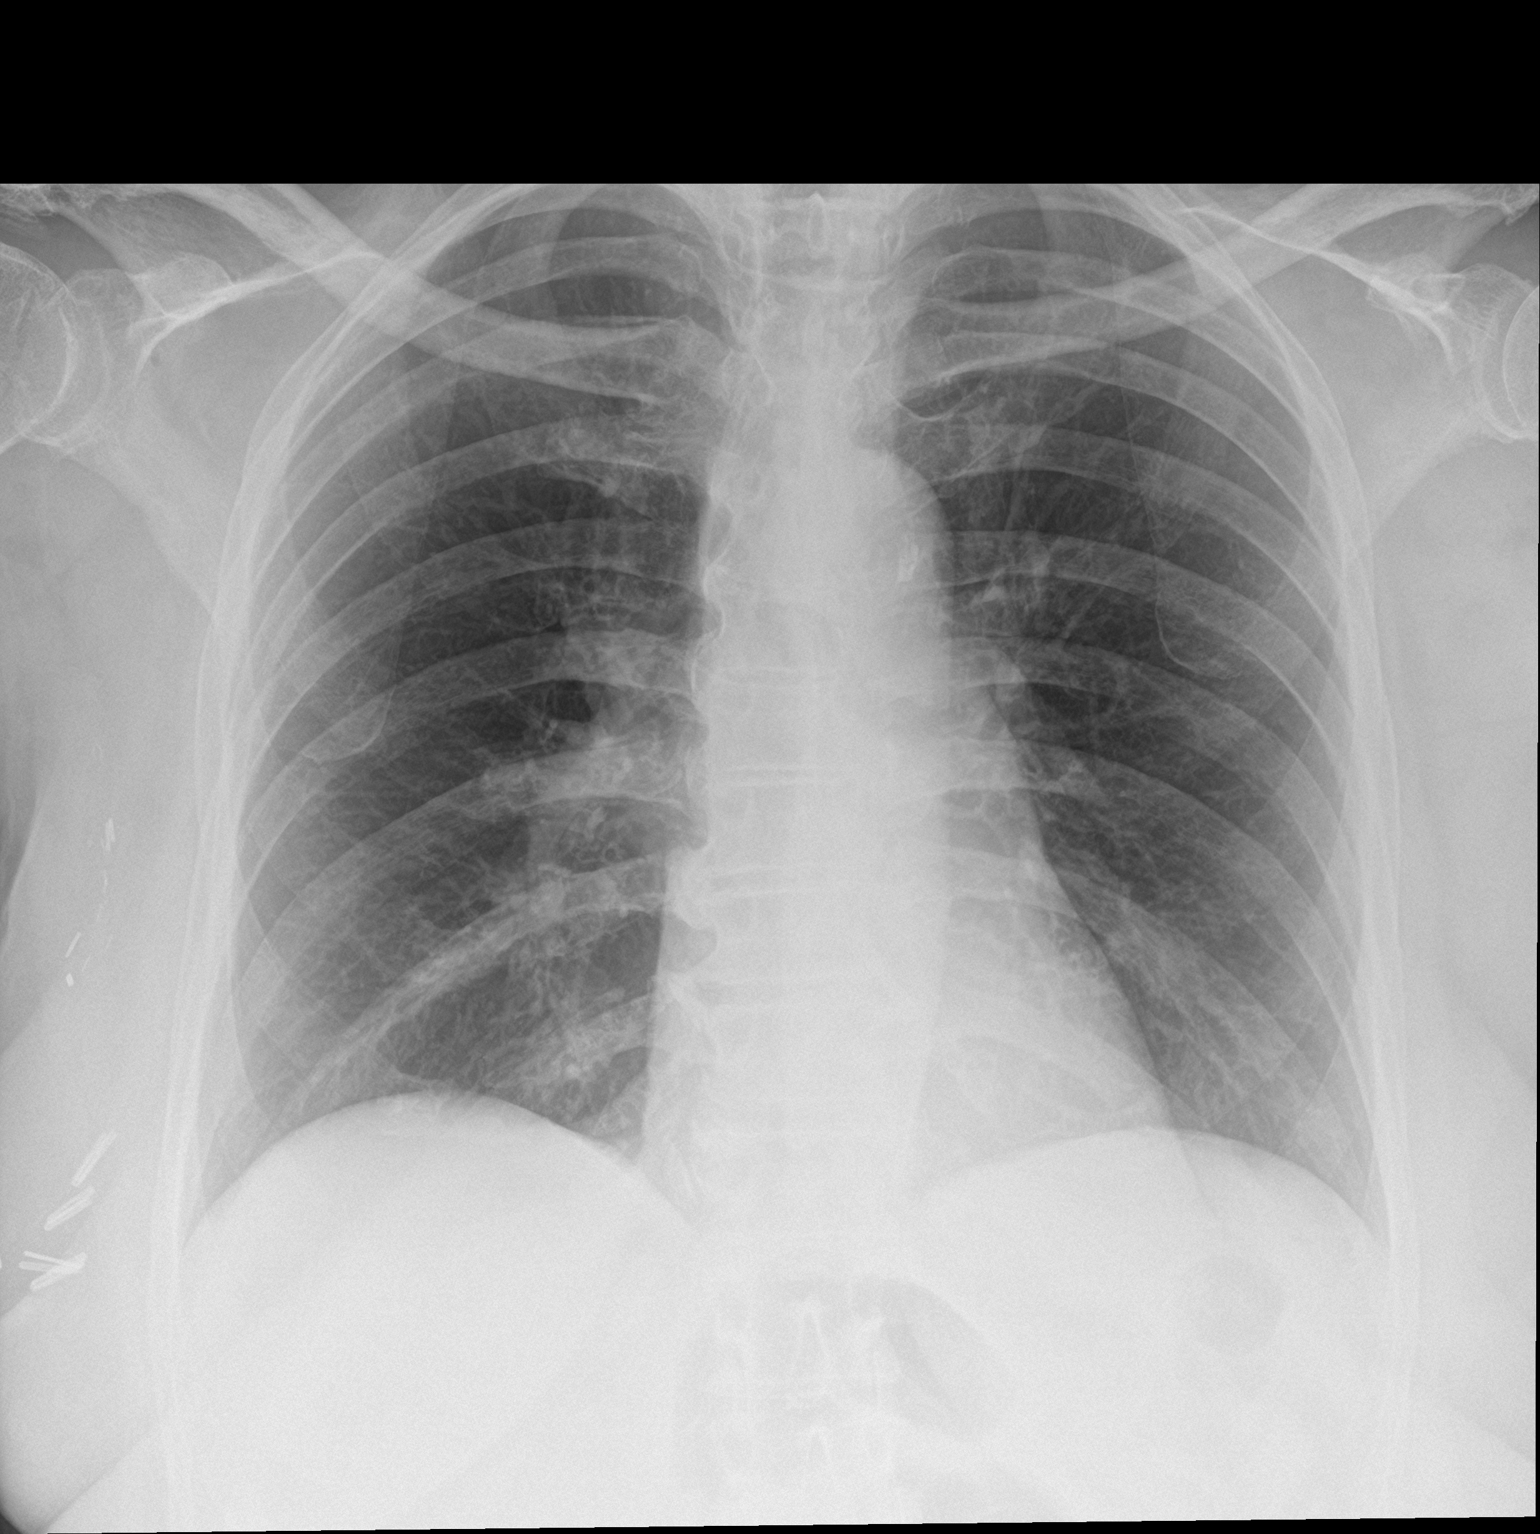

[chest lat]
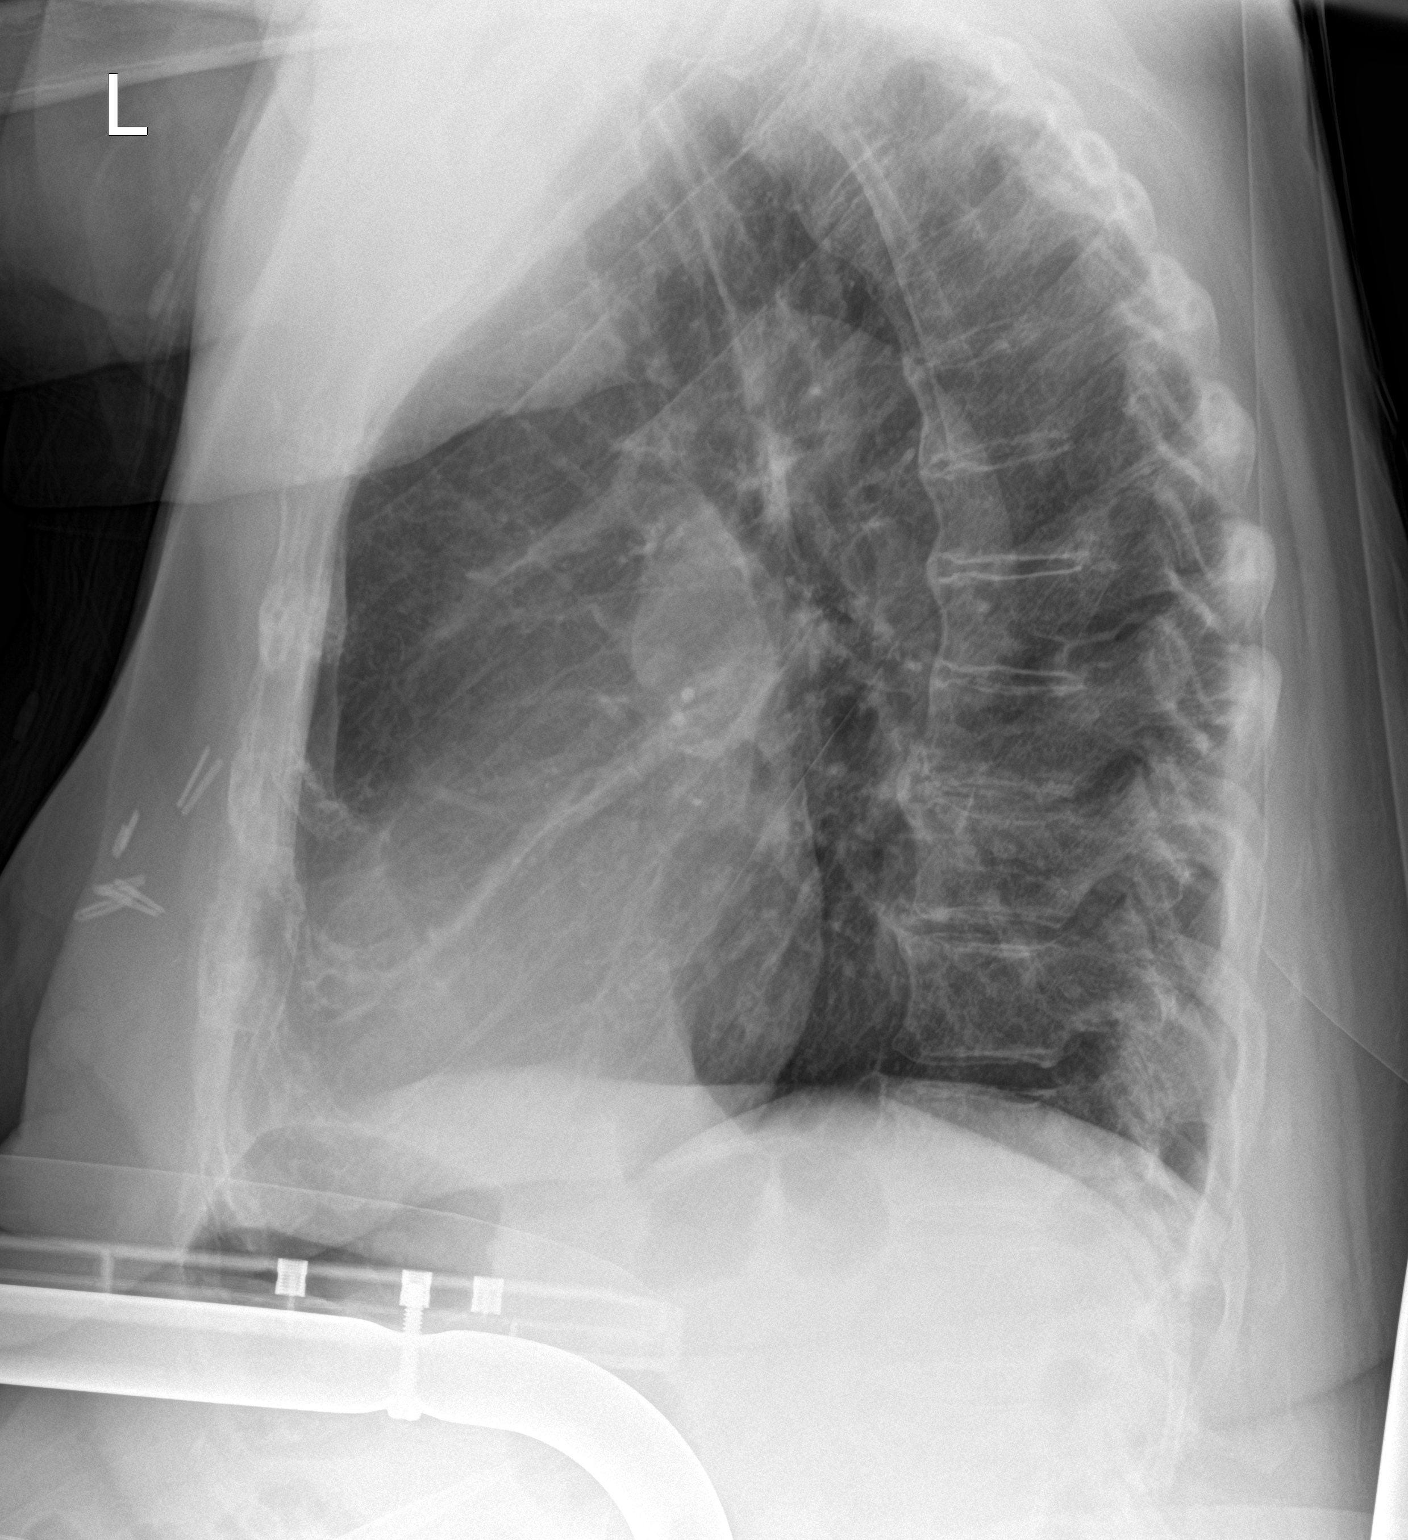

[2 of 2 positions shown; findings below may reference images not displayed]

FINDINGS: The cardiomediastinal contours are normal. The lungs are clear.
Pulmonary vasculature is normal. No consolidation, pleural effusion,
or pneumothorax. No acute osseous abnormalities are seen. Surgical
clips in the right breast/axilla. There is degenerative change in
the spine.
IMPRESSION: No acute abnormality.

## 2020-06-18 DIAGNOSIS — E119 Type 2 diabetes mellitus without complications: Secondary | ICD-10-CM | POA: Diagnosis not present

## 2020-06-18 DIAGNOSIS — I10 Essential (primary) hypertension: Secondary | ICD-10-CM | POA: Diagnosis not present

## 2020-07-09 DIAGNOSIS — E119 Type 2 diabetes mellitus without complications: Secondary | ICD-10-CM | POA: Diagnosis not present

## 2020-07-09 DIAGNOSIS — I10 Essential (primary) hypertension: Secondary | ICD-10-CM | POA: Diagnosis not present

## 2020-07-29 DIAGNOSIS — C50411 Malignant neoplasm of upper-outer quadrant of right female breast: Secondary | ICD-10-CM | POA: Diagnosis not present

## 2020-07-29 DIAGNOSIS — Z170 Estrogen receptor positive status [ER+]: Secondary | ICD-10-CM | POA: Diagnosis not present

## 2020-07-29 DIAGNOSIS — R922 Inconclusive mammogram: Secondary | ICD-10-CM | POA: Diagnosis not present

## 2020-08-03 DIAGNOSIS — E119 Type 2 diabetes mellitus without complications: Secondary | ICD-10-CM | POA: Diagnosis not present

## 2020-08-03 DIAGNOSIS — I10 Essential (primary) hypertension: Secondary | ICD-10-CM | POA: Diagnosis not present

## 2021-06-21 ENCOUNTER — Emergency Department (HOSPITAL_COMMUNITY): Payer: Medicare Other

## 2021-06-21 ENCOUNTER — Other Ambulatory Visit: Payer: Self-pay

## 2021-06-21 ENCOUNTER — Encounter (HOSPITAL_COMMUNITY): Payer: Self-pay

## 2021-06-21 ENCOUNTER — Emergency Department (HOSPITAL_COMMUNITY)
Admission: EM | Admit: 2021-06-21 | Discharge: 2021-06-21 | Disposition: A | Discharge status: Home / Self Care | Payer: Medicare Other | Attending: Emergency Medicine | Admitting: Emergency Medicine

## 2021-06-21 DIAGNOSIS — I10 Essential (primary) hypertension: Secondary | ICD-10-CM | POA: Insufficient documentation

## 2021-06-21 DIAGNOSIS — Z859 Personal history of malignant neoplasm, unspecified: Secondary | ICD-10-CM | POA: Insufficient documentation

## 2021-06-21 DIAGNOSIS — R109 Unspecified abdominal pain: Secondary | ICD-10-CM | POA: Insufficient documentation

## 2021-06-21 DIAGNOSIS — Z79899 Other long term (current) drug therapy: Secondary | ICD-10-CM | POA: Insufficient documentation

## 2021-06-21 LAB — CBC WITH DIFFERENTIAL/PLATELET
Abs Immature Granulocytes: 0.01 10*3/uL (ref 0.00–0.07)
Basophils Absolute: 0 10*3/uL (ref 0.0–0.1)
Basophils Relative: 0 %
Eosinophils Absolute: 0 10*3/uL (ref 0.0–0.5)
Eosinophils Relative: 0 %
HCT: 40.3 % (ref 36.0–46.0)
Hemoglobin: 12.9 g/dL (ref 12.0–15.0)
Immature Granulocytes: 0 %
Lymphocytes Relative: 23 %
Lymphs Abs: 1.2 10*3/uL (ref 0.7–4.0)
MCH: 31.3 pg (ref 26.0–34.0)
MCHC: 32 g/dL (ref 30.0–36.0)
MCV: 97.8 fL (ref 80.0–100.0)
Monocytes Absolute: 0.6 10*3/uL (ref 0.1–1.0)
Monocytes Relative: 12 %
Neutro Abs: 3.5 10*3/uL (ref 1.7–7.7)
Neutrophils Relative %: 65 %
Platelets: 187 10*3/uL (ref 150–400)
RBC: 4.12 MIL/uL (ref 3.87–5.11)
RDW: 12.5 % (ref 11.5–15.5)
WBC: 5.3 10*3/uL (ref 4.0–10.5)
nRBC: 0 % (ref 0.0–0.2)

## 2021-06-21 LAB — COMPREHENSIVE METABOLIC PANEL
ALT: 23 U/L (ref 0–44)
AST: 29 U/L (ref 15–41)
Albumin: 4 g/dL (ref 3.5–5.0)
Alkaline Phosphatase: 36 U/L — ABNORMAL LOW (ref 38–126)
Anion gap: 7 (ref 5–15)
BUN: 18 mg/dL (ref 8–23)
CO2: 29 mmol/L (ref 22–32)
Calcium: 9.6 mg/dL (ref 8.9–10.3)
Chloride: 102 mmol/L (ref 98–111)
Creatinine, Ser: 1.07 mg/dL — ABNORMAL HIGH (ref 0.44–1.00)
GFR, Estimated: 51 mL/min — ABNORMAL LOW (ref >60)
Glucose, Bld: 104 mg/dL — ABNORMAL HIGH (ref 70–99)
Potassium: 3.8 mmol/L (ref 3.5–5.1)
Sodium: 138 mmol/L (ref 135–145)
Total Bilirubin: 0.8 mg/dL (ref 0.3–1.2)
Total Protein: 7.4 g/dL (ref 6.5–8.1)

## 2021-06-21 LAB — URINALYSIS, ROUTINE W REFLEX MICROSCOPIC
Bilirubin Urine: NEGATIVE
Glucose, UA: NEGATIVE mg/dL
Hgb urine dipstick: NEGATIVE
Ketones, ur: NEGATIVE mg/dL
Leukocytes,Ua: NEGATIVE
Nitrite: NEGATIVE
Protein, ur: NEGATIVE mg/dL
Specific Gravity, Urine: 1.013 (ref 1.005–1.030)
pH: 8 (ref 5.0–8.0)

## 2021-06-21 LAB — LIPASE, BLOOD: Lipase: 21 U/L (ref 11–51)

## 2021-06-21 MED ORDER — TRAMADOL HCL 50 MG PO TABS: 50.0000 mg | ORAL_TABLET | Freq: Four times a day (QID) | ORAL | 0 refills | Status: AC | PRN

## 2021-06-21 NOTE — ED Notes (Signed)
Pt unable to get urine specimen at this time

## 2021-06-21 NOTE — Discharge Instructions (Signed)
Make an appointment to follow-up with your doctor.  Work-up here today negative labs normal.  CT scan negative symptoms probably musculoskeletal in nature.  Take the tramadol as needed for additional pain relief.

## 2021-06-21 NOTE — ED Notes (Signed)
Took pt to the bathroom via wheelchair  

## 2021-06-21 NOTE — ED Provider Notes (Signed)
[facility] EMERGENCY DEPARTMENT Provider Note   CSN: [id number] Arrival date & time: 06/21/21  4098     History Chief Complaint  Patient presents with   Flank Pain    Elizabeth Weaver is a 86 y.o. female.  Patient presents to the emergency department for evaluation of abdominal pain.  Patient has been experiencing left lateral abdominal pain for 3 days.  Patient reports a sharp pain that only occurs when she moves.  Patient reports that she was at the beach and did a lot of walking.  She thought that she overdid it and it would go away but it is still hurting.  Patient reports that she has a lot of pain when she tries to roll over in bed or sit up from a lying position.  No associated fever, nausea, vomiting, diarrhea or constipation.  She has not had any urinary symptoms.      Past Medical History:  Diagnosis Date   Cancer ([place])    High cholesterol    Hypertension    Stomach ulcer     There are no problems to display for this patient.   Past Surgical History:  Procedure Laterality Date   BREAST SURGERY     TONSILLECTOMY       OB History   No obstetric history on file.     No family history on file.  Social History   Tobacco Use   Smoking status: Never   Smokeless tobacco: Never  Substance Use Topics   Alcohol use: No   Drug use: No    Home Medications Prior to Admission medications   Medication Sig Start Date End Date Taking? Authorizing Provider  albuterol (PROVENTIL HFA;VENTOLIN HFA) 108 (90 Base) MCG/ACT inhaler Inhale 2 puffs into the lungs every 4 (four) hours as needed for wheezing or shortness of breath. 11/17/15   Ward, Cyril Mourning N, DO  atenolol (TENORMIN) 25 MG tablet Take 50 mg by mouth daily. Take two tablets daily    [provider]  atorvastatin (LIPITOR) 20 MG tablet Take 20 mg by mouth daily.    [provider]  Cholecalciferol (VITAMIN D3) 1000 units CAPS Take 1 capsule by mouth daily. 06/14/11   [provider]   hydrochlorothiazide (HYDRODIURIL) 25 MG tablet Take 25 mg by mouth daily.    [provider]  NIFEdipine (ADALAT CC) 90 MG 24 hr tablet Take 90 mg by mouth daily.    [provider]  ondansetron (ZOFRAN ODT) 4 MG disintegrating tablet 4mg  ODT q4 hours prn nausea/vomit 11/05/16   Milton Ferguson, MD  oseltamivir (TAMIFLU) 75 MG capsule Take 1 capsule (75 mg total) by mouth every 12 (twelve) hours. 10/21/17   Orpah Greek, MD  Potassium Chloride CR (MICRO-K) 8 MEQ CPCR capsule CR Take 8 mEq by mouth daily. Take 3 tablets daily    [provider]  predniSONE (DELTASONE) 20 MG tablet Take 2 tablets (40 mg total) by mouth daily with breakfast. 10/21/17   Kae Lauman, Gwenyth Allegra, MD  quinapril (ACCUPRIL) 40 MG tablet Take 40 mg by mouth daily.    [provider]  tamoxifen (NOLVADEX) 20 MG tablet Take 20 mg by mouth daily.    [provider]    Allergies    Aspirin  Review of Systems   Review of Systems  Gastrointestinal:  Positive for abdominal pain.  All other systems reviewed and are negative.  Physical Exam Updated Vital Signs BP 135/61   Pulse 91   Temp Marland Kitchen)  97.3 F (36.3 C)   Resp 17   Ht 5\' 4"  (1.626 m)   Wt 88.5 kg   SpO2 98%   BMI 33.47 kg/m   Physical Exam Vitals and nursing note reviewed.  Constitutional:      General: She is not in acute distress.    Appearance: Normal appearance. She is well-developed.  HENT:     Head: Normocephalic and atraumatic.     Right Ear: Hearing normal.     Left Ear: Hearing normal.     Nose: Nose normal.  Eyes:     Conjunctiva/sclera: Conjunctivae normal.     Pupils: Pupils are equal, round, and reactive to light.  Cardiovascular:     Rate and Rhythm: Regular rhythm.     Heart sounds: S1 normal and S2 normal. No murmur heard.   No friction rub. No gallop.  Pulmonary:     Effort: Pulmonary effort is normal. No respiratory distress.     Breath sounds: Normal breath sounds.  Chest:      Chest wall: No tenderness.  Abdominal:     General: Bowel sounds are normal.     Palpations: Abdomen is soft.     Tenderness: There is no abdominal tenderness. There is no guarding or rebound. Negative signs include Murphy's sign and McBurney's sign.     Hernia: No hernia is present.  Musculoskeletal:        General: Normal range of motion.     Cervical back: Normal range of motion and neck supple.  Skin:    General: Skin is warm and dry.     Findings: No rash.  Neurological:     Mental Status: She is alert and oriented to person, place, and time.     GCS: GCS eye subscore is 4. GCS verbal subscore is 5. GCS motor subscore is 6.     Cranial Nerves: No cranial nerve deficit.     Sensory: No sensory deficit.     Coordination: Coordination normal.  Psychiatric:        Speech: Speech normal.        Behavior: Behavior normal.        Thought Content: Thought content normal.    ED Results / Procedures / Treatments   Labs (all labs ordered are listed, but only abnormal results are displayed) Labs Reviewed  CBC WITH DIFFERENTIAL/PLATELET  COMPREHENSIVE METABOLIC PANEL  LIPASE, BLOOD  URINALYSIS, ROUTINE W REFLEX MICROSCOPIC    EKG None  Radiology No results found.  Procedures Procedures   Medications Ordered in ED Medications - No data to display  ED Course  I have reviewed the triage vital signs and the nursing notes.  Pertinent labs & imaging results that were available during my care of the patient were reviewed by me and considered in my medical decision making (see chart for details).    MDM Rules/Calculators/A&P                           Patient presents with left lateral abdominal pain.  Pain only occurs with movement of the abdominal wall musculature.  Patient's pain is reproduced with bending, twisting.  Additionally in the sitting position she has difficulty raising the left leg against gravity because of abdominal pain.  Examination is most consistent with  abdominal wall strain.  Will perform work-up to rule out other etiology.  Will signout to oncoming ER physician to follow-up results.  Final Clinical Impression(s) / ED Diagnoses  Final diagnoses:  Abdominal pain, unspecified abdominal location    Rx / DC Orders ED Discharge Orders     None        Orpah Greek, MD 06/21/21 [phone]

## 2021-06-21 NOTE — ED Triage Notes (Addendum)
Pt c/o left flank pain that began 3 days ago, states she went to the beach and she did "a lot" of walking and believes it may be from that. Denies any urinary symptoms

## 2021-06-21 NOTE — ED Provider Notes (Signed)
Patient's labs here without any acute findings.  Did do CT renal just to rule out any internal abnormalities since patient elderly.  That was negative.  Symptoms probably mostly musculoskeletal.  Will treat with tramadol and have her follow-up with her doctors.   Fredia Sorrow, MD 06/21/21 1018

## 2021-10-30 ENCOUNTER — Other Ambulatory Visit: Payer: Self-pay

## 2021-10-30 ENCOUNTER — Emergency Department (HOSPITAL_COMMUNITY)
Admission: EM | Admit: 2021-10-30 | Discharge: 2021-10-30 | Disposition: A | Discharge status: Home / Self Care | Payer: Medicare Other | Attending: Emergency Medicine | Admitting: Emergency Medicine

## 2021-10-30 ENCOUNTER — Emergency Department (HOSPITAL_COMMUNITY): Payer: Medicare Other

## 2021-10-30 ENCOUNTER — Encounter (HOSPITAL_COMMUNITY): Payer: Self-pay | Admitting: Emergency Medicine

## 2021-10-30 DIAGNOSIS — R1032 Left lower quadrant pain: Secondary | ICD-10-CM | POA: Diagnosis not present

## 2021-10-30 DIAGNOSIS — R1031 Right lower quadrant pain: Secondary | ICD-10-CM | POA: Insufficient documentation

## 2021-10-30 DIAGNOSIS — R112 Nausea with vomiting, unspecified: Secondary | ICD-10-CM | POA: Insufficient documentation

## 2021-10-30 DIAGNOSIS — R1030 Lower abdominal pain, unspecified: Secondary | ICD-10-CM

## 2021-10-30 LAB — CBC WITH DIFFERENTIAL/PLATELET
Abs Immature Granulocytes: 0.01 10*3/uL (ref 0.00–0.07)
Basophils Absolute: 0 10*3/uL (ref 0.0–0.1)
Basophils Relative: 0 %
Eosinophils Absolute: 0 10*3/uL (ref 0.0–0.5)
Eosinophils Relative: 0 %
HCT: 42.2 % (ref 36.0–46.0)
Hemoglobin: 13.5 g/dL (ref 12.0–15.0)
Immature Granulocytes: 0 %
Lymphocytes Relative: 14 %
Lymphs Abs: 1.1 10*3/uL (ref 0.7–4.0)
MCH: 30.6 pg (ref 26.0–34.0)
MCHC: 32 g/dL (ref 30.0–36.0)
MCV: 95.7 fL (ref 80.0–100.0)
Monocytes Absolute: 0.6 10*3/uL (ref 0.1–1.0)
Monocytes Relative: 8 %
Neutro Abs: 6.3 10*3/uL (ref 1.7–7.7)
Neutrophils Relative %: 78 %
Platelets: 202 10*3/uL (ref 150–400)
RBC: 4.41 MIL/uL (ref 3.87–5.11)
RDW: 12.7 % (ref 11.5–15.5)
WBC: 8 10*3/uL (ref 4.0–10.5)
nRBC: 0 % (ref 0.0–0.2)

## 2021-10-30 LAB — URINALYSIS, ROUTINE W REFLEX MICROSCOPIC
Bacteria, UA: NONE SEEN
Bilirubin Urine: NEGATIVE
Glucose, UA: NEGATIVE mg/dL
Ketones, ur: NEGATIVE mg/dL
Nitrite: NEGATIVE
Protein, ur: 30 mg/dL — AB
Specific Gravity, Urine: 1.021 (ref 1.005–1.030)
pH: 6 (ref 5.0–8.0)

## 2021-10-30 LAB — COMPREHENSIVE METABOLIC PANEL
ALT: 19 U/L (ref 0–44)
AST: 23 U/L (ref 15–41)
Albumin: 3.9 g/dL (ref 3.5–5.0)
Alkaline Phosphatase: 48 U/L (ref 38–126)
Anion gap: 9 (ref 5–15)
BUN: 16 mg/dL (ref 8–23)
CO2: 30 mmol/L (ref 22–32)
Calcium: 9.7 mg/dL (ref 8.9–10.3)
Chloride: 99 mmol/L (ref 98–111)
Creatinine, Ser: 1.15 mg/dL — ABNORMAL HIGH (ref 0.44–1.00)
GFR, Estimated: 47 mL/min — ABNORMAL LOW (ref >60)
Glucose, Bld: 114 mg/dL — ABNORMAL HIGH (ref 70–99)
Potassium: 3.4 mmol/L — ABNORMAL LOW (ref 3.5–5.1)
Sodium: 138 mmol/L (ref 135–145)
Total Bilirubin: 0.7 mg/dL (ref 0.3–1.2)
Total Protein: 7.3 g/dL (ref 6.5–8.1)

## 2021-10-30 LAB — LIPASE, BLOOD: Lipase: 23 U/L (ref 11–51)

## 2021-10-30 MED ORDER — ONDANSETRON HCL 4 MG PO TABS: 4.0000 mg | ORAL_TABLET | Freq: Three times a day (TID) | ORAL | 0 refills | Status: AC | PRN

## 2021-10-30 MED ORDER — IOHEXOL 300 MG/ML  SOLN
80.0000 mL | Freq: Once | INTRAMUSCULAR | Status: AC | PRN
  Administered 2021-10-30: 100 mL via INTRAVENOUS

## 2021-10-30 NOTE — ED Notes (Signed)
Tolerated PO challenge well. Informed MD ?

## 2021-10-30 NOTE — ED Notes (Signed)
286m of water given to pt.  ?

## 2021-10-30 NOTE — ED Triage Notes (Signed)
Pt presents today c/o lower abd pain since yesterday and nausea with dry heaves. Denies vomiting, fever or other sx. Denies being around anyone with similar sx.  ?

## 2021-10-30 NOTE — ED Provider Notes (Signed)
[facility] Provider Note   CSN: [id number] Arrival date & time: 10/30/21  1409     History  Chief Complaint  Patient presents with   Abdominal Pain   Nausea    Elizabeth Weaver is a 87 y.o. female.  She is here with a complaint of lower abdominal pain and 2 episodes of vomiting that started last evening.  She thinks it is related to lasagna that she paid last night.  No fevers or chills.  No chest pain shortness of breath.  No urinary symptoms.  No diarrhea.  Pain was 5 out of 10 but currently 0.  The history is provided by the patient.  Abdominal Pain Pain location:  LLQ and RLQ Pain quality: cramping   Pain radiates to:  Does not radiate Pain severity:  Moderate Onset quality:  Gradual Timing:  Intermittent Progression:  Resolved Chronicity:  New Context: not trauma   Relieved by:  Liquids Worsened by:  Nothing Ineffective treatments:  None tried Associated symptoms: nausea and vomiting   Associated symptoms: no chest pain, no constipation, no cough, no diarrhea, no dysuria, no fever and no shortness of breath       Home Medications Prior to Admission medications   Medication Sig Start Date End Date Taking? Authorizing Provider  albuterol (PROVENTIL HFA;VENTOLIN HFA) 108 (90 Base) MCG/ACT inhaler Inhale 2 puffs into the lungs every 4 (four) hours as needed for wheezing or shortness of breath. 11/17/15   Ward, Cyril Mourning N, DO  atenolol (TENORMIN) 25 MG tablet Take 50 mg by mouth daily. Take two tablets daily    [provider]  atorvastatin (LIPITOR) 40 MG tablet Take 40 mg by mouth daily as needed. 06/10/21   [provider]  CALCIUM 600/VITAMIN D3 600-20 MG-MCG TABS Take 1 tablet by mouth 2 (two) times daily. 06/10/21   [provider]  cetirizine (ZYRTEC) 10 MG tablet Take 10 mg by mouth daily. 06/11/21   [provider]  Cholecalciferol (VITAMIN D3) 1000 units CAPS Take 1 capsule by mouth daily. 06/14/11    [provider]  hydrochlorothiazide (HYDRODIURIL) 25 MG tablet Take 25 mg by mouth daily.    [provider]  NIFEdipine (ADALAT CC) 90 MG 24 hr tablet Take 90 mg by mouth daily.    [provider]  ondansetron (ZOFRAN ODT) 4 MG disintegrating tablet '4mg'$  ODT q4 hours prn nausea/vomit 11/05/16   Milton Ferguson, MD  oseltamivir (TAMIFLU) 75 MG capsule Take 1 capsule (75 mg total) by mouth every 12 (twelve) hours. 10/21/17   Orpah Greek, MD  potassium chloride (KLOR-CON) 8 MEQ tablet Take 1 tablet by mouth daily. 06/10/21   [provider]  predniSONE (DELTASONE) 20 MG tablet Take 2 tablets (40 mg total) by mouth daily with breakfast. 10/21/17   Pollina, Gwenyth Allegra, MD  quinapril (ACCUPRIL) 40 MG tablet Take 40 mg by mouth daily.    [provider]  SPIRIVA HANDIHALER 18 MCG inhalation capsule Place 1 capsule into inhaler and inhale daily. 05/13/21   [provider]  tamoxifen (NOLVADEX) 20 MG tablet Take 20 mg by mouth daily.    [provider]  traMADol (ULTRAM) 50 MG tablet Take 1 tablet (50 mg total) by mouth every 6 (six) hours as needed. 06/21/21   Fredia Sorrow, MD      Allergies    Aspirin    Review of Systems   Review of Systems  Constitutional:  Negative for fever.  Respiratory:  Negative  for cough and shortness of breath.   Cardiovascular:  Negative for chest pain.  Gastrointestinal:  Positive for abdominal pain, nausea and vomiting. Negative for constipation and diarrhea.  Genitourinary:  Negative for dysuria.  Neurological:  Negative for headaches.   Physical Exam Updated Vital Signs BP (!) 149/79    Pulse 92    Temp 99.9 F (37.7 C) (Oral)    Resp 18    Ht '5\' 4"'$  (1.626 m)    Wt 86.2 kg    SpO2 97%    BMI 32.61 kg/m  Physical Exam Vitals and nursing note reviewed.  Constitutional:      General: She is not in acute distress.    Appearance: Normal appearance. She is well-developed.  HENT:     Head:  Normocephalic and atraumatic.  Eyes:     Conjunctiva/sclera: Conjunctivae normal.  Cardiovascular:     Rate and Rhythm: Normal rate and regular rhythm.     Heart sounds: No murmur heard. Pulmonary:     Effort: Pulmonary effort is normal. No respiratory distress.     Breath sounds: Normal breath sounds.  Abdominal:     Palpations: Abdomen is soft.     Tenderness: There is no abdominal tenderness. There is no guarding or rebound.  Musculoskeletal:        General: No swelling. Normal range of motion.     Cervical back: Neck supple.  Skin:    General: Skin is warm and dry.     Capillary Refill: Capillary refill takes less than 2 seconds.  Neurological:     General: No focal deficit present.     Mental Status: She is alert.    ED Results / Procedures / Treatments   Labs (all labs ordered are listed, but only abnormal results are displayed) Labs Reviewed  COMPREHENSIVE METABOLIC PANEL - Abnormal; Notable for the following components:      Result Value   Potassium 3.4 (*)    Glucose, Bld 114 (*)    Creatinine, Ser 1.15 (*)    GFR, Estimated 47 (*)    All other components within normal limits  URINALYSIS, ROUTINE W REFLEX MICROSCOPIC - Abnormal; Notable for the following components:   Hgb urine dipstick SMALL (*)    Protein, ur 30 (*)    Leukocytes,Ua SMALL (*)    Non Squamous Epithelial 0-5 (*)    All other components within normal limits  CBC WITH DIFFERENTIAL/PLATELET  LIPASE, BLOOD    EKG None  Radiology CT Abdomen Pelvis W Contrast  Result Date: 10/30/2021 CLINICAL DATA:  87 year old female with acute abdominal and pelvic pain. EXAM: CT ABDOMEN AND PELVIS WITH CONTRAST TECHNIQUE: Multidetector CT imaging of the abdomen and pelvis was performed using the standard protocol following bolus administration of intravenous contrast. RADIATION DOSE REDUCTION: This exam was performed according to the departmental dose-optimization program which includes automated exposure  control, adjustment of the mA and/or kV according to patient size and/or use of iterative reconstruction technique. CONTRAST:  122m OMNIPAQUE IOHEXOL 300 MG/ML  SOLN COMPARISON:  06/21/2021 and prior CTs FINDINGS: Lower chest: No acute abnormality. Hepatobiliary: CBD and intrahepatic biliary dilatation to the ampulla again noted with obstructing cause identified. This CBD measures 18 mm but relatively unchanged since 2018. No other hepatic abnormalities are identified. The gallbladder is unremarkable. Pancreas: Mild pancreatic ductal dilatation is unchanged from 2018. No other abnormalities identified. Spleen: Unremarkable Adrenals/Urinary Tract: The kidneys, adrenal glands and bladder are unremarkable. Stomach/Bowel: Stomach is within normal limits. Appendix appears normal.  No evidence of bowel wall thickening, distention, or inflammatory changes. Colonic and small bowel diverticula are again noted without evidence of acute diverticulitis. Vascular/Lymphatic: Aortic atherosclerosis. No enlarged abdominal or pelvic lymph nodes. Reproductive: Uterus and bilateral adnexa are unremarkable. Other: Small amount of ascites/free fluid within the pelvis is now noted, nonspecific. There is no evidence of pneumoperitoneum or focal collection/abscess. Musculoskeletal: No acute or suspicious bony abnormalities are noted. IMPRESSION: 1. New small amount of nonspecific ascites/free fluid within the pelvis. 2. Intrahepatic biliary, CBD and pancreatic ductal dilatation to the ampulla with obstructing cause identified. This is relatively unchanged since 2018. 3. Aortic Atherosclerosis (ICD10-I70.0). Electronically Signed   By: Margarette Canada M.D.   On: 10/30/2021 17:07    Procedures Procedures    Medications Ordered in ED Medications - No data to display  ED Course/ Medical Decision Making/ A&P                           Medical Decision Making Amount and/or Complexity of Data Reviewed Radiology:  ordered.  Risk Prescription drug management.  This patient complains of abdominal pain nausea vomiting; this involves an extensive number of treatment Options and is a complaint that carries with it a high risk of complications and morbidity. The differential includes acute gastroenteritis, food poisoning, gastritis, colitis, diverticulitis, obstruction, perforation  I ordered, reviewed and interpreted labs, which included CBC with normal white count normal hemoglobin, chemistries fairly normal other than mildly low potassium elevated creatinine, urinalysis possible infection although no bacteria seen and no symptoms I ordered imaging studies which included CT abdomen and pelvis and I independently    visualized and interpreted imaging which showed small amount of nonspecific fluid in pelvis Previous records obtained and reviewed in epic no recent admissions Social determinants considered, no significant barriers Critical Interventions: None  After the interventions stated above, I reevaluated the patient and found patient have a benign abdominal exam no symptoms currently. Admission and further testing considered, no indications for admission or further testing at this time.  Provided prescription for some nausea medication to use as needed.  Recommended close follow-up with PCP and return instructions discussed          Final Clinical Impression(s) / ED Diagnoses Final diagnoses:  Lower abdominal pain  Nausea and vomiting, unspecified vomiting type    Rx / DC Orders ED Discharge Orders          Ordered    ondansetron (ZOFRAN) 4 MG tablet  Every 8 hours PRN        10/30/21 1754              Hayden Rasmussen, MD 10/31/21 [phone]

## 2021-10-30 NOTE — Discharge Instructions (Addendum)
You are seen in the emergency department for lower abdominal pain nausea and vomiting.  You had blood work urinalysis and a CAT scan that did not show an obvious explanation for your symptoms.  Please start with a clear liquid diet and advance as tolerated.  Follow-up with your primary care doctor.  Return if any worsening or concerning symptoms. ?

## 2022-06-05 ENCOUNTER — Other Ambulatory Visit: Payer: Self-pay

## 2022-06-05 ENCOUNTER — Emergency Department (HOSPITAL_COMMUNITY)
Admission: EM | Admit: 2022-06-05 | Discharge: 2022-06-05 | Disposition: A | Discharge status: Home / Self Care | Payer: Medicare Other | Attending: Emergency Medicine | Admitting: Emergency Medicine

## 2022-06-05 ENCOUNTER — Encounter (HOSPITAL_COMMUNITY): Payer: Self-pay

## 2022-06-05 DIAGNOSIS — R0781 Pleurodynia: Secondary | ICD-10-CM | POA: Insufficient documentation

## 2022-06-05 DIAGNOSIS — Y92410 Unspecified street and highway as the place of occurrence of the external cause: Secondary | ICD-10-CM | POA: Diagnosis not present

## 2022-06-05 NOTE — Discharge Instructions (Addendum)
I recommend use incentive spirometer at home.  You will take 10 slow breaths at a time, using this 10 times a day, for the next 10 days.
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
## 2023-10-29 IMAGING — CT CT ABD-PELV W/ CM
2 of 5 series · 16 of 46 positions shown, 18 images · IV contrast (agent unspecified)
Comparison: 06/21/2021 and prior CTs

CLINICAL DATA: 86-year-old female with acute abdominal and pelvic
pain.

EXAM:
CT ABDOMEN AND PELVIS WITH CONTRAST
TECHNIQUE: Multidetector CT imaging of the abdomen and pelvis was performed
using the standard protocol following bolus administration of
intravenous contrast.

[Series 2: axial st · axial · 0.86mm/px · z∈[+928,+1353]mm · 13 of 97 slices shown, 15 images]
[im 6/97  soft-tissue]
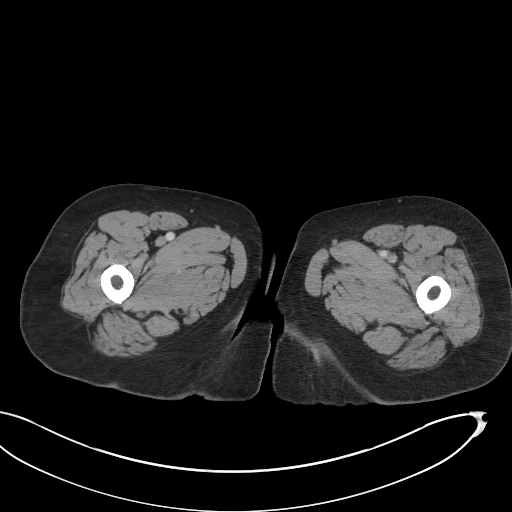
[im 6/97  bone]
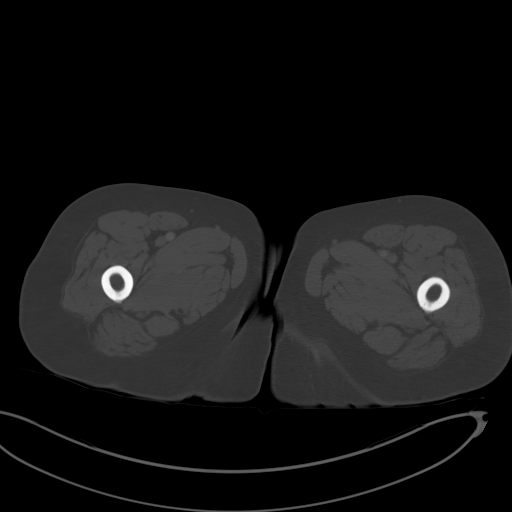
[im 11/97  soft-tissue]
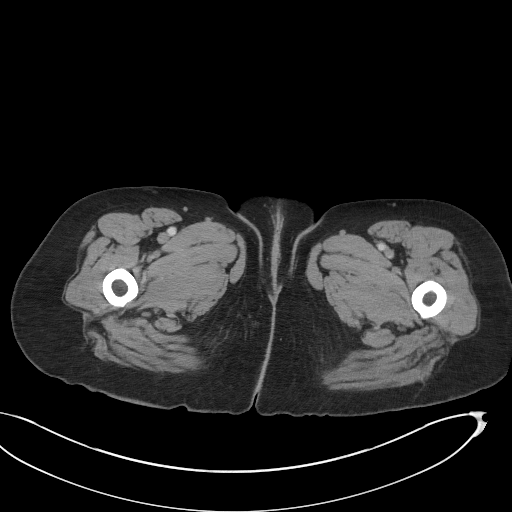
[im 22/97  soft-tissue]
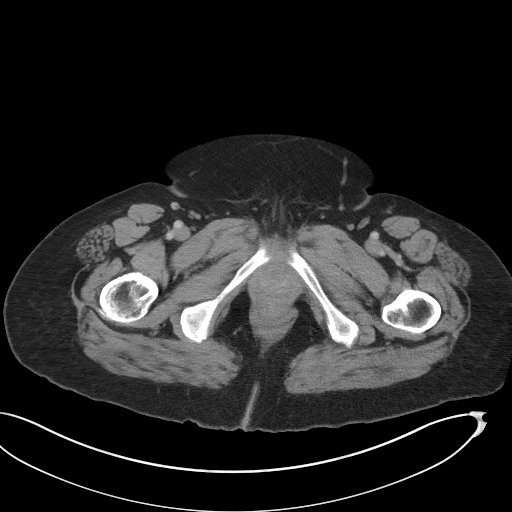
[im 27/97  soft-tissue]
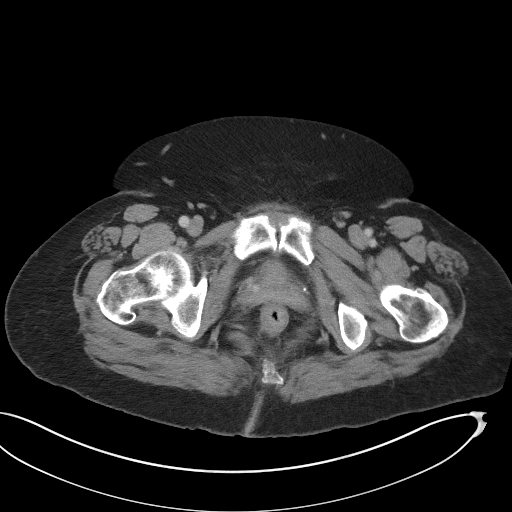
[im 33/97  soft-tissue]
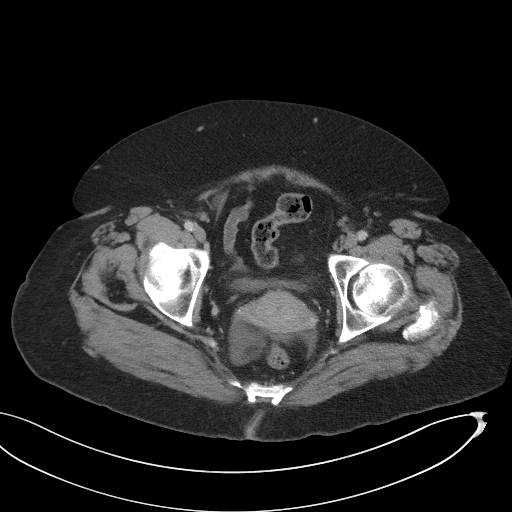
[im 43/97  soft-tissue]
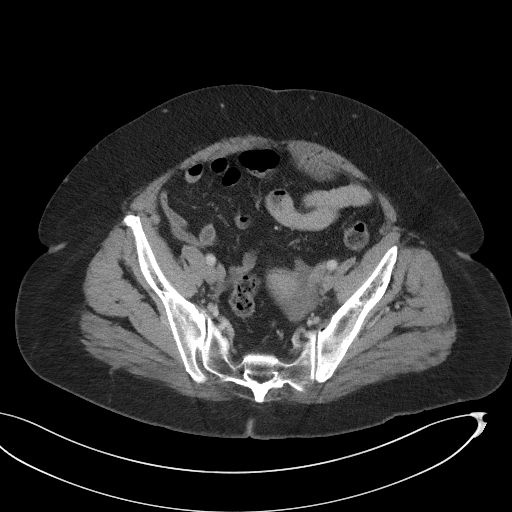
[im 49/97  soft-tissue]
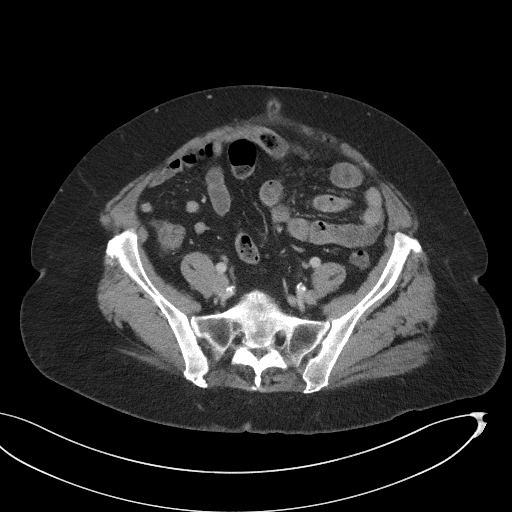
[im 54/97  soft-tissue]
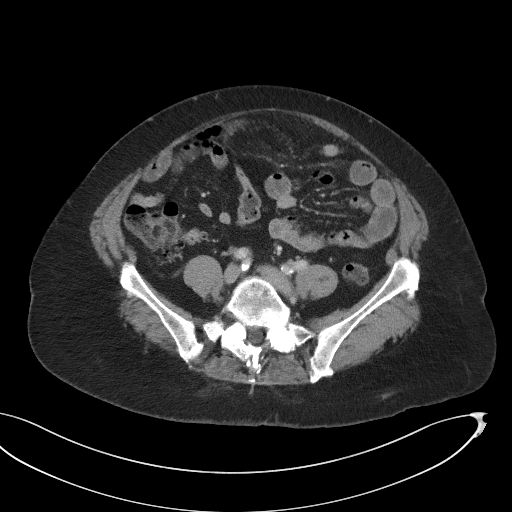
[im 65/97  soft-tissue]
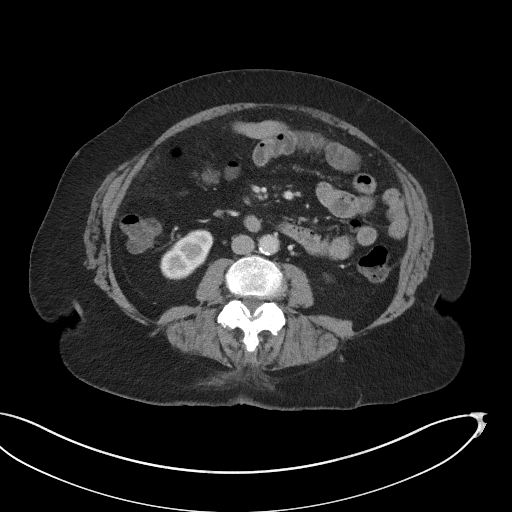
[im 65/97  bone]
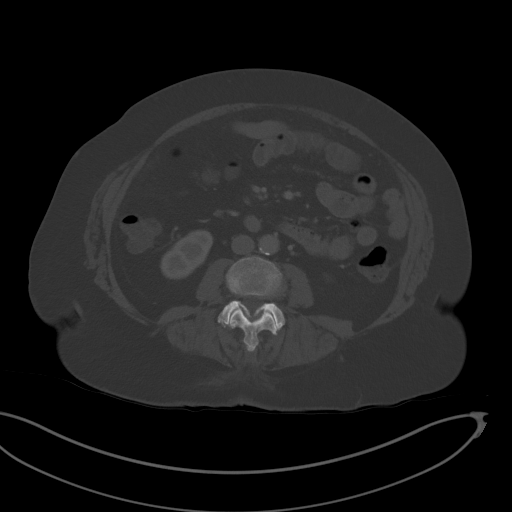
[im 70/97  soft-tissue]
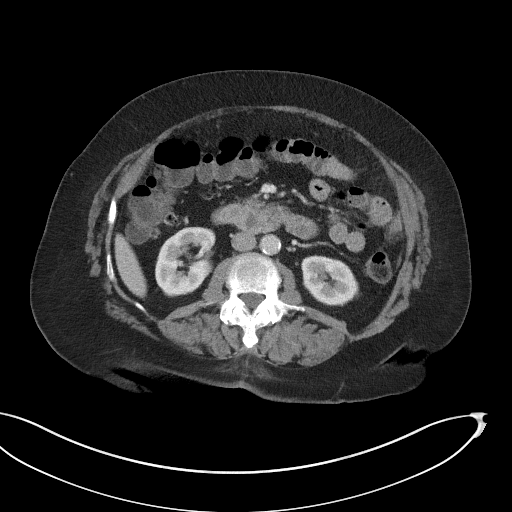
[im 75/97  soft-tissue]
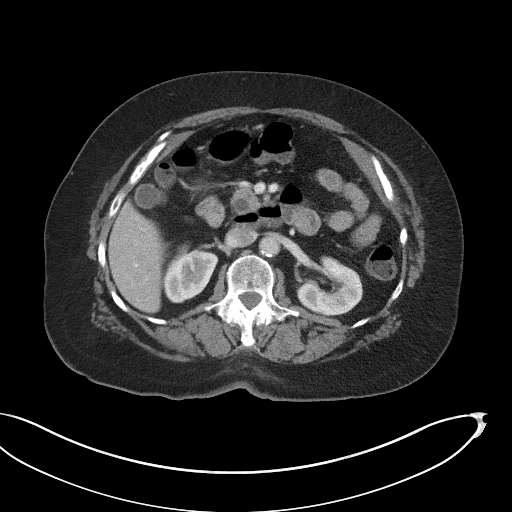
[im 86/97  soft-tissue]
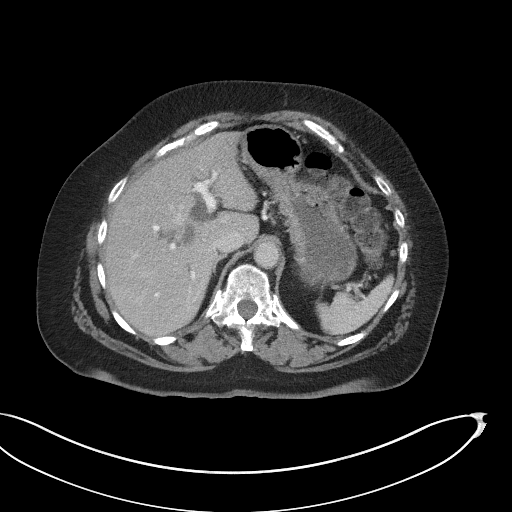
[im 91/97  soft-tissue]
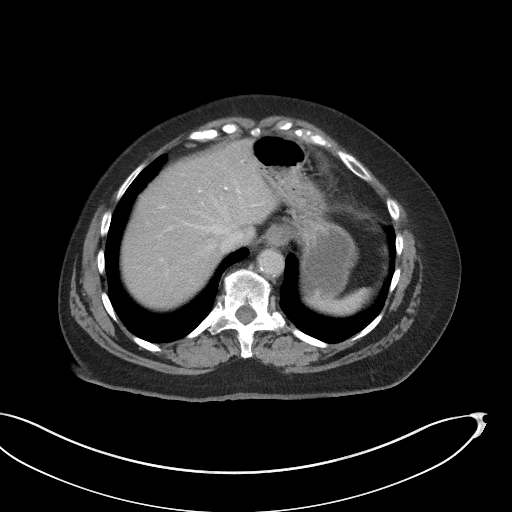

[Series 5: coronal st · coronal · 0.82mm/px · 3 of 118 slices shown]
[im 40/118  soft-tissue]
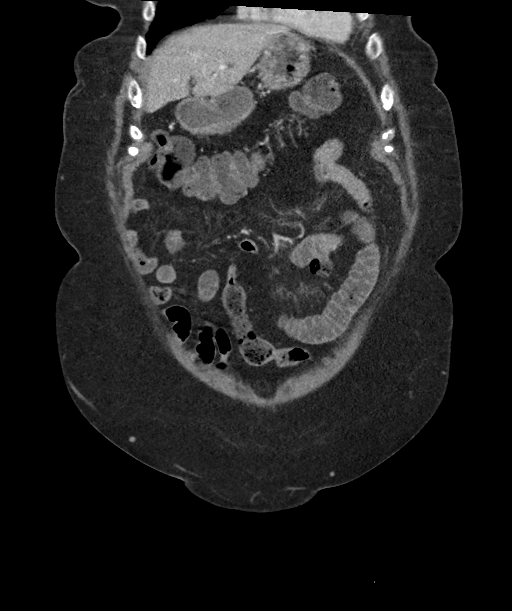
[im 53/118  soft-tissue]
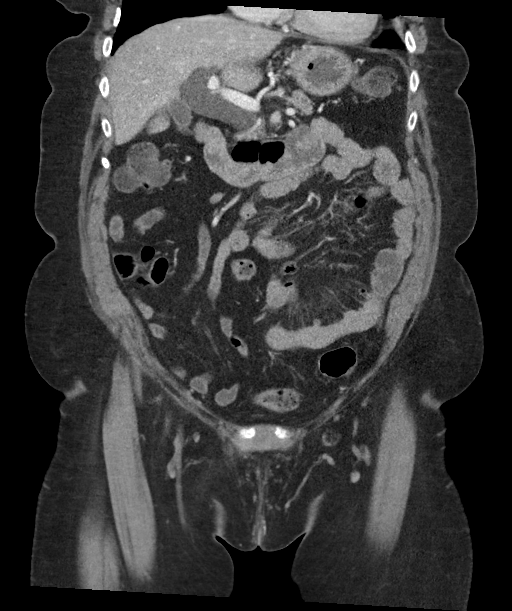
[im 66/118  soft-tissue]
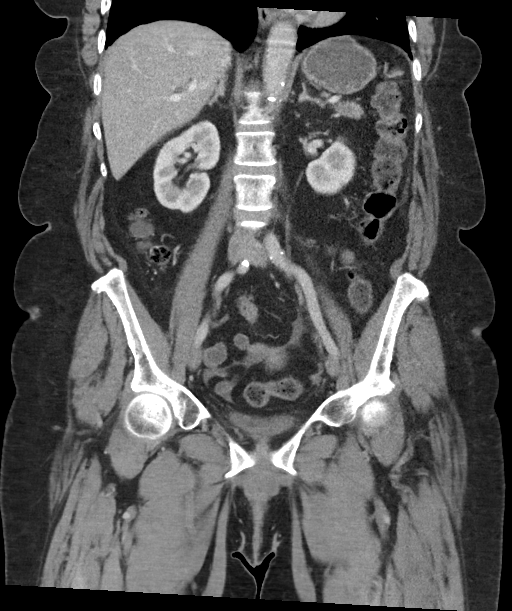

[16 of 46 positions shown; findings below may reference images not displayed]

RADIATION DOSE REDUCTION: This exam was performed according to the
departmental dose-optimization program which includes automated
exposure control, adjustment of the mA and/or kV according to
patient size and/or use of iterative reconstruction technique.

CONTRAST:  100mL OMNIPAQUE IOHEXOL 300 MG/ML  SOLN
FINDINGS: Lower chest: No acute abnormality.

Hepatobiliary: CBD and intrahepatic biliary dilatation to the
ampulla again noted with obstructing cause identified. This CBD
measures 18 mm but relatively unchanged since 5448.

No other hepatic abnormalities are identified. The gallbladder is
unremarkable.

Pancreas: Mild pancreatic ductal dilatation is unchanged from 5448.
No other abnormalities identified.

Spleen: Unremarkable

Adrenals/Urinary Tract: The kidneys, adrenal glands and bladder are
unremarkable.

Stomach/Bowel: Stomach is within normal limits. Appendix appears
normal. No evidence of bowel wall thickening, distention, or
inflammatory changes.

Colonic and small bowel diverticula are again noted without evidence
of acute diverticulitis.

Vascular/Lymphatic: Aortic atherosclerosis. No enlarged abdominal or
pelvic lymph nodes.

Reproductive: Uterus and bilateral adnexa are unremarkable.

Other: Small amount of ascites/free fluid within the pelvis is now
noted, nonspecific. There is no evidence of pneumoperitoneum or
focal collection/abscess.

Musculoskeletal: No acute or suspicious bony abnormalities are
noted.
IMPRESSION: 1. New small amount of nonspecific ascites/free fluid within the
pelvis.
2. Intrahepatic biliary, CBD and pancreatic ductal dilatation to the
ampulla with obstructing cause identified. This is relatively
unchanged since [DATE]. Aortic Atherosclerosis ([id number]).
